# Patient Record
Sex: Female | Born: 1960 | Race: White | Hispanic: No | Smoking: Never smoker
Health system: Southern US, Community
[De-identification: ages and names within clinical notes are randomized; demographics above are authoritative.]

## PROBLEM LIST (undated history)

## (undated) DIAGNOSIS — E119 Type 2 diabetes mellitus without complications: Secondary | ICD-10-CM

## (undated) DIAGNOSIS — N200 Calculus of kidney: Secondary | ICD-10-CM

## (undated) DIAGNOSIS — D219 Benign neoplasm of connective and other soft tissue, unspecified: Secondary | ICD-10-CM

## (undated) DIAGNOSIS — G510 Bell's palsy: Secondary | ICD-10-CM

## (undated) DIAGNOSIS — D869 Sarcoidosis, unspecified: Secondary | ICD-10-CM

## (undated) DIAGNOSIS — K5090 Crohn's disease, unspecified, without complications: Secondary | ICD-10-CM

## (undated) HISTORY — PX: LITHOTRIPSY: SUR834

## (undated) HISTORY — PX: APPENDECTOMY: SHX54

## (undated) HISTORY — PX: UTERINE FIBROID EMBOLIZATION: SHX825

## (undated) HISTORY — PX: CHOLECYSTECTOMY: SHX55

## (undated) HISTORY — PX: TONSILLECTOMY: SUR1361

---

## 1998-11-24 ENCOUNTER — Other Ambulatory Visit: Admission: RE | Admit: 1998-11-24 | Discharge: 1998-11-24 | Payer: Self-pay | Admitting: Obstetrics and Gynecology

## 1999-02-02 ENCOUNTER — Encounter: Payer: Self-pay | Admitting: Obstetrics and Gynecology

## 1999-02-02 ENCOUNTER — Ambulatory Visit (HOSPITAL_COMMUNITY): Admission: RE | Admit: 1999-02-02 | Discharge: 1999-02-02 | Payer: Self-pay | Admitting: Obstetrics and Gynecology

## 1999-03-05 ENCOUNTER — Inpatient Hospital Stay (HOSPITAL_COMMUNITY): Admission: AD | Admit: 1999-03-05 | Discharge: 1999-03-05 | Payer: Self-pay | Admitting: Obstetrics and Gynecology

## 1999-06-09 ENCOUNTER — Encounter (INDEPENDENT_AMBULATORY_CARE_PROVIDER_SITE_OTHER): Payer: Self-pay

## 1999-06-09 ENCOUNTER — Inpatient Hospital Stay (HOSPITAL_COMMUNITY): Admission: AD | Admit: 1999-06-09 | Discharge: 1999-06-12 | Payer: Self-pay | Admitting: Obstetrics and Gynecology

## 1999-07-24 ENCOUNTER — Other Ambulatory Visit: Admission: RE | Admit: 1999-07-24 | Discharge: 1999-07-24 | Payer: Self-pay | Admitting: Obstetrics and Gynecology

## 2000-09-16 ENCOUNTER — Other Ambulatory Visit: Admission: RE | Admit: 2000-09-16 | Discharge: 2000-09-16 | Payer: Self-pay | Admitting: Obstetrics and Gynecology

## 2005-05-20 ENCOUNTER — Encounter: Admission: RE | Admit: 2005-05-20 | Discharge: 2005-05-20 | Payer: Self-pay | Admitting: Obstetrics and Gynecology

## 2006-01-13 ENCOUNTER — Ambulatory Visit (HOSPITAL_COMMUNITY): Admission: RE | Admit: 2006-01-13 | Discharge: 2006-01-13 | Payer: Self-pay | Admitting: Urology

## 2006-02-03 ENCOUNTER — Ambulatory Visit (HOSPITAL_COMMUNITY): Admission: RE | Admit: 2006-02-03 | Discharge: 2006-02-03 | Payer: Self-pay | Admitting: Urology

## 2008-05-08 ENCOUNTER — Encounter: Admission: RE | Admit: 2008-05-08 | Discharge: 2008-05-08 | Payer: Self-pay | Admitting: Sports Medicine

## 2008-06-14 ENCOUNTER — Encounter: Admission: RE | Admit: 2008-06-14 | Discharge: 2008-06-14 | Payer: Self-pay | Admitting: Sports Medicine

## 2008-07-12 ENCOUNTER — Ambulatory Visit: Payer: Self-pay | Admitting: Physical Medicine & Rehabilitation

## 2009-09-02 ENCOUNTER — Encounter: Admission: RE | Admit: 2009-09-02 | Discharge: 2009-09-02 | Payer: Self-pay | Admitting: Endocrinology

## 2009-09-04 ENCOUNTER — Encounter: Admission: RE | Admit: 2009-09-04 | Discharge: 2009-09-04 | Payer: Self-pay | Admitting: Obstetrics and Gynecology

## 2009-09-18 ENCOUNTER — Ambulatory Visit (HOSPITAL_COMMUNITY): Admission: RE | Admit: 2009-09-18 | Discharge: 2009-09-19 | Payer: Self-pay | Admitting: Interventional Radiology

## 2009-10-07 ENCOUNTER — Encounter: Admission: RE | Admit: 2009-10-07 | Discharge: 2009-10-07 | Payer: Self-pay | Admitting: Interventional Radiology

## 2010-04-11 ENCOUNTER — Ambulatory Visit: Payer: Self-pay | Admitting: Diagnostic Radiology

## 2010-04-11 ENCOUNTER — Emergency Department (HOSPITAL_BASED_OUTPATIENT_CLINIC_OR_DEPARTMENT_OTHER): Admission: EM | Admit: 2010-04-11 | Discharge: 2010-04-12 | Payer: Self-pay | Admitting: Emergency Medicine

## 2010-04-12 ENCOUNTER — Emergency Department (HOSPITAL_BASED_OUTPATIENT_CLINIC_OR_DEPARTMENT_OTHER): Admission: EM | Admit: 2010-04-12 | Discharge: 2010-04-12 | Payer: Self-pay | Admitting: Emergency Medicine

## 2010-04-21 ENCOUNTER — Encounter: Admission: RE | Admit: 2010-04-21 | Discharge: 2010-04-21 | Payer: Self-pay | Admitting: Interventional Radiology

## 2010-08-23 ENCOUNTER — Encounter: Admission: RE | Admit: 2010-08-23 | Discharge: 2010-08-23 | Payer: Self-pay | Admitting: Internal Medicine

## 2010-09-03 ENCOUNTER — Encounter: Admission: RE | Admit: 2010-09-03 | Discharge: 2010-09-03 | Payer: Self-pay | Admitting: Sports Medicine

## 2010-09-12 ENCOUNTER — Ambulatory Visit: Payer: Self-pay | Admitting: Diagnostic Radiology

## 2010-09-12 ENCOUNTER — Emergency Department (HOSPITAL_BASED_OUTPATIENT_CLINIC_OR_DEPARTMENT_OTHER): Admission: EM | Admit: 2010-09-12 | Discharge: 2010-09-13 | Payer: Self-pay | Admitting: Emergency Medicine

## 2010-09-13 ENCOUNTER — Emergency Department (HOSPITAL_COMMUNITY): Admission: EM | Admit: 2010-09-13 | Discharge: 2010-09-13 | Payer: Self-pay | Admitting: Emergency Medicine

## 2010-12-14 ENCOUNTER — Encounter: Payer: Self-pay | Admitting: Sports Medicine

## 2011-02-03 LAB — DIFFERENTIAL
Eosinophils Relative: 1 % (ref 0–5)
Lymphocytes Relative: 43 % (ref 12–46)
Monocytes Relative: 4 % (ref 3–12)
Neutrophils Relative %: 49 % (ref 43–77)
Smear Review: ADEQUATE

## 2011-02-03 LAB — POCT I-STAT, CHEM 8
Calcium, Ion: 1.14 mmol/L (ref 1.12–1.32)
Chloride: 109 mEq/L (ref 96–112)
Glucose, Bld: 112 mg/dL — ABNORMAL HIGH (ref 70–99)
Potassium: 4.4 mEq/L (ref 3.5–5.1)

## 2011-02-03 LAB — BASIC METABOLIC PANEL
Calcium: 9.7 mg/dL (ref 8.4–10.5)
Chloride: 102 mEq/L (ref 96–112)
GFR calc Af Amer: >60 mL/min (ref >60)
GFR calc non Af Amer: >60 mL/min (ref >60)
Potassium: 5 mEq/L (ref 3.5–5.1)
Sodium: 144 mEq/L (ref 135–145)

## 2011-02-03 LAB — URINALYSIS, ROUTINE W REFLEX MICROSCOPIC
Bilirubin Urine: NEGATIVE
Glucose, UA: NEGATIVE mg/dL
Glucose, UA: NEGATIVE mg/dL
Hgb urine dipstick: NEGATIVE
Ketones, ur: NEGATIVE mg/dL
Ketones, ur: NEGATIVE mg/dL
Nitrite: NEGATIVE
Specific Gravity, Urine: 1.026 (ref 1.005–1.030)
Urobilinogen, UA: 0.2 mg/dL (ref 0.0–1.0)
pH: 5 (ref 5.0–8.0)

## 2011-02-03 LAB — CBC
MCH: 33.2 pg (ref 26.0–34.0)
MCV: 97.1 fL (ref 78.0–100.0)

## 2011-02-08 LAB — DIFFERENTIAL
Basophils Relative: 1 % (ref 0–1)
Lymphocytes Relative: 7 % — ABNORMAL LOW (ref 12–46)
Lymphs Abs: 0.8 10*3/uL (ref 0.7–4.0)
Monocytes Relative: 12 % (ref 3–12)
Neutrophils Relative %: 80 % — ABNORMAL HIGH (ref 43–77)

## 2011-02-08 LAB — CBC
HCT: 29.2 % — ABNORMAL LOW (ref 36.0–46.0)
Hemoglobin: 10.2 g/dL — ABNORMAL LOW (ref 12.0–15.0)
MCHC: 34.8 g/dL (ref 30.0–36.0)
MCV: 97.6 fL (ref 78.0–100.0)
Platelets: 223 10*3/uL (ref 150–400)
RDW: 11.9 % (ref 11.5–15.5)

## 2011-02-08 LAB — COMPREHENSIVE METABOLIC PANEL
Albumin: 3.5 g/dL (ref 3.5–5.2)
BUN: 15 mg/dL (ref 6–23)
Calcium: 8.5 mg/dL (ref 8.4–10.5)
Chloride: 98 mEq/L (ref 96–112)
GFR calc Af Amer: >60 mL/min (ref >60)
Potassium: 3.3 mEq/L — ABNORMAL LOW (ref 3.5–5.1)
Total Bilirubin: 0.6 mg/dL (ref 0.3–1.2)

## 2011-02-08 LAB — URINALYSIS, ROUTINE W REFLEX MICROSCOPIC
Protein, ur: 30 mg/dL — AB
Specific Gravity, Urine: 1.017 (ref 1.005–1.030)
Urobilinogen, UA: 0.2 mg/dL (ref 0.0–1.0)

## 2011-02-08 LAB — URINE MICROSCOPIC-ADD ON

## 2011-02-08 LAB — URINE CULTURE: Colony Count: >=100000

## 2011-02-15 ENCOUNTER — Other Ambulatory Visit: Payer: Self-pay | Admitting: Sports Medicine

## 2011-02-15 DIAGNOSIS — M549 Dorsalgia, unspecified: Secondary | ICD-10-CM

## 2011-02-17 ENCOUNTER — Other Ambulatory Visit: Payer: Self-pay | Admitting: Sports Medicine

## 2011-02-17 ENCOUNTER — Ambulatory Visit
Admission: RE | Admit: 2011-02-17 | Discharge: 2011-02-17 | Disposition: A | Discharge status: Home / Self Care | Payer: BC Managed Care – PPO | Source: Ambulatory Visit | Attending: Sports Medicine | Admitting: Sports Medicine

## 2011-02-17 DIAGNOSIS — M549 Dorsalgia, unspecified: Secondary | ICD-10-CM

## 2011-02-25 LAB — CREATININE, SERUM: Creatinine, Ser: 0.6 mg/dL (ref 0.4–1.2)

## 2011-02-25 LAB — CBC
Hemoglobin: 13.1 g/dL (ref 12.0–15.0)
MCHC: 34 g/dL (ref 30.0–36.0)
RDW: 12.9 % (ref 11.5–15.5)

## 2011-04-06 NOTE — Group Therapy Note (Signed)
CONSULT REQUESTED BY:  Frazier Butt, D.O.   Consult requested for the evaluation of left buttocks pain.   HISTORY:  [name] who has a 1-year history of low back/left  buttocks pain.  She notes no traumatic injury.  She had been quite  physically active playing tennis and exercising before this occurred.  She had further workup including MRI of the lumbar spine which  reportedly showed L3-L4 degenerative disk.  She had physical therapy  which consists of heat, TENS, and leg strengthening exercise.  She has  pain that is relieved by bringing her knees to her chest.  She has pain  that averages in the 5/10 to 6/10 range.  Because of her decreased  activity, she states she has gained 30 pounds last year.  Pain with  activity is around 6/10, and she has decreased sitting and standing  tolerance of approximately 30 minutes.  She rates her pain as moderate  currently.  She cannot lift heavy weights, but she can do light to  median weights if conveniently positioned.  She can walk about half a  mile at a time.  Her sleep is occasionally disturbed by pain.  She has  difficulty traveling more than 2 hours.  Her Oswestry disability index  is graded as 38% which is in the moderate range.   She has had a sacroiliac injection on May 08, 2008, which helped quite  dramaticallly for about 2 weeks.  However, the second one performed on  June 14, 2008, really did not help at all.  She takes about 2  hydrocodone a day.  She has had some success with ibuprofen as well, but  it only lasted 3-4 hours in her case.  She has not tried any longer  lasting NSAIDs such as Naprosyn.   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   PHYSICAL EXAMINATION:  VITAL SIGNS:  Her blood pressure is 138/90, pulse  120, weight 188 pounds, and height 5 feet 2.  GENERAL:  Mildly anxious female, in no acute distress.  Gait is normal.  She is able to toe walk and heel walk.  Her deep tendon reflexes  are  normal in bilateral upper and lower extremities.  EXTREMITIES:  Without edema.  Her back range of motion is full.  She  does have pain in the PSIS to palpation on the left side only.  Her  motor strength is 5/5 in bilateral deltoid, biceps, triceps, grip, as  well as hip flexion, knee extension, and ankle dorsiflexion.  Her hip  internal and external rotation normal.  Lower extremity, knee, and ankle  range of motion are normal.  Upper extremity strength and range of  motion is normal.  Deep tendon reflexes are normal in bilateral upper  and lower extremity.  Straight leg raising test is negative.  FABER's  maneuver is mildly positive on the left.  This is in the PSIS area.   IMPRESSION:  Left buttocks pain.  It is in the region that is commonly  associated with sacroiliac pain, but lower lumbar facet can refer pain  to a similar region.  I think it is less likely that this is discogenic  pain although technically still in the differential.  Does not have any  nerve root tension signs.  No lower extremity radiating symptomatology.  Also in the differential would be myofascial pain in the gluteus medius  and maximus area.   PLAN:  1. Given that she has  had equivocal or she has had conflicting results      on her 2 sacroiliac injections, we would pursue another pain      generator in this case at L4-L5 and L5-S1 facet complex.  If she      has significant relief with this, may benefit from radiofrequency      procedure.  2. Check urine drug screen prior to prescribing any narcotic      analgesics.  She is generally not in favor of taking narcotic      analgesics and would like to see if she can try something else.  To      this effect, we will prescribe Naprosyn 500 b.i.d. given that she      has had some relief with nonsteroidal antiinflammatory drugs, but      not long enough duration with ibuprofen.   Thank you for this interesting consultation.  I did discuss this case  with  Dr. Margaretha Sheffield, and he will be faxing an MRI result that I do not have  in the original referral pack.  I will see the patient back for the  injection.      Stacey Colon, M.D.  Electronically Signed     AEK/MedQ  D:  07/12/2008 16:17:16  T:  07/13/2008 05:56:27  Job #:  [id number]

## 2011-11-19 ENCOUNTER — Observation Stay (HOSPITAL_COMMUNITY): Payer: BC Managed Care – PPO

## 2011-11-19 ENCOUNTER — Emergency Department (INDEPENDENT_AMBULATORY_CARE_PROVIDER_SITE_OTHER): Payer: BC Managed Care – PPO

## 2011-11-19 ENCOUNTER — Other Ambulatory Visit: Payer: Self-pay

## 2011-11-19 ENCOUNTER — Inpatient Hospital Stay (HOSPITAL_BASED_OUTPATIENT_CLINIC_OR_DEPARTMENT_OTHER)
Admission: EM | Admit: 2011-11-19 | Discharge: 2011-11-21 | DRG: 316 | Disposition: A | Discharge status: Home / Self Care | Payer: BC Managed Care – PPO | Source: Ambulatory Visit | Attending: Family Medicine | Admitting: Family Medicine

## 2011-11-19 DIAGNOSIS — N179 Acute kidney failure, unspecified: Secondary | ICD-10-CM | POA: Diagnosis present

## 2011-11-19 DIAGNOSIS — H9209 Otalgia, unspecified ear: Secondary | ICD-10-CM

## 2011-11-19 DIAGNOSIS — M545 Low back pain, unspecified: Secondary | ICD-10-CM | POA: Diagnosis present

## 2011-11-19 DIAGNOSIS — R29810 Facial weakness: Secondary | ICD-10-CM

## 2011-11-19 DIAGNOSIS — D869 Sarcoidosis, unspecified: Secondary | ICD-10-CM | POA: Diagnosis present

## 2011-11-19 DIAGNOSIS — K5090 Crohn's disease, unspecified, without complications: Secondary | ICD-10-CM | POA: Diagnosis present

## 2011-11-19 DIAGNOSIS — I498 Other specified cardiac arrhythmias: Secondary | ICD-10-CM | POA: Diagnosis not present

## 2011-11-19 DIAGNOSIS — G510 Bell's palsy: Secondary | ICD-10-CM | POA: Diagnosis present

## 2011-11-19 DIAGNOSIS — I10 Essential (primary) hypertension: Secondary | ICD-10-CM | POA: Diagnosis not present

## 2011-11-19 DIAGNOSIS — R51 Headache: Secondary | ICD-10-CM | POA: Diagnosis not present

## 2011-11-19 HISTORY — DX: Sarcoidosis, unspecified: D86.9

## 2011-11-19 HISTORY — DX: Crohn's disease, unspecified, without complications: K50.90

## 2011-11-19 LAB — DIFFERENTIAL
Eosinophils Absolute: 0.5 10*3/uL (ref 0.0–0.7)
Eosinophils Relative: 4 % (ref 0–5)
Lymphs Abs: 2.4 10*3/uL (ref 0.7–4.0)
Monocytes Relative: 10 % (ref 3–12)

## 2011-11-19 LAB — COMPREHENSIVE METABOLIC PANEL
BUN: 40 mg/dL — ABNORMAL HIGH (ref 6–23)
CO2: 22 mEq/L (ref 19–32)
Calcium: 10.4 mg/dL (ref 8.4–10.5)
Creatinine, Ser: 2 mg/dL — ABNORMAL HIGH (ref 0.50–1.10)
GFR calc Af Amer: 32 mL/min — ABNORMAL LOW (ref >90)
GFR calc non Af Amer: 28 mL/min — ABNORMAL LOW (ref >90)
Glucose, Bld: 144 mg/dL — ABNORMAL HIGH (ref 70–99)

## 2011-11-19 LAB — URINALYSIS, ROUTINE W REFLEX MICROSCOPIC
Bilirubin Urine: NEGATIVE
Glucose, UA: 100 mg/dL — AB
Ketones, ur: NEGATIVE mg/dL
Protein, ur: NEGATIVE mg/dL

## 2011-11-19 LAB — PROTIME-INR
INR: 1.11 (ref 0.00–1.49)
Prothrombin Time: 14.5 seconds (ref 11.6–15.2)

## 2011-11-19 LAB — CBC
MCH: 30.3 pg (ref 26.0–34.0)
MCV: 89.6 fL (ref 78.0–100.0)
Platelets: 528 10*3/uL — ABNORMAL HIGH (ref 150–400)
RBC: 4.13 MIL/uL (ref 3.87–5.11)

## 2011-11-19 LAB — URINE MICROSCOPIC-ADD ON

## 2011-11-19 MED ORDER — ENOXAPARIN SODIUM 30 MG/0.3ML ~~LOC~~ SOLN
30.0000 mg | SUBCUTANEOUS | Status: DC
  Administered 2011-11-20 (×2): 30 mg via SUBCUTANEOUS
  Filled 2011-11-19 (×3): qty 0.3

## 2011-11-19 MED ORDER — MORPHINE SULFATE 4 MG/ML IJ SOLN
4.0000 mg | Freq: Once | INTRAMUSCULAR | Status: AC
  Administered 2011-11-19: 4 mg via INTRAVENOUS
  Filled 2011-11-19: qty 1

## 2011-11-19 MED ORDER — SODIUM CHLORIDE 0.9 % IV BOLUS (SEPSIS)
1000.0000 mL | Freq: Once | INTRAVENOUS | Status: AC
  Administered 2011-11-19: 1000 mL via INTRAVENOUS

## 2011-11-19 MED ORDER — ZOLPIDEM TARTRATE 5 MG PO TABS
5.0000 mg | ORAL_TABLET | Freq: Every evening | ORAL | Status: DC | PRN
  Administered 2011-11-20 (×2): 5 mg via ORAL
  Filled 2011-11-19 (×2): qty 1

## 2011-11-19 MED ORDER — SODIUM CHLORIDE 0.9 % IV SOLN
INTRAVENOUS | Status: DC
  Administered 2011-11-20 – 2011-11-21 (×5): via INTRAVENOUS

## 2011-11-19 MED ORDER — METHYLPREDNISOLONE SODIUM SUCC 125 MG IJ SOLR
60.0000 mg | Freq: Two times a day (BID) | INTRAMUSCULAR | Status: DC
  Administered 2011-11-20 (×2): 60 mg via INTRAVENOUS
  Filled 2011-11-19: qty 0.96
  Filled 2011-11-19: qty 2
  Filled 2011-11-19: qty 0.96

## 2011-11-19 MED ORDER — PANTOPRAZOLE SODIUM 40 MG PO TBEC
40.0000 mg | DELAYED_RELEASE_TABLET | Freq: Every day | ORAL | Status: DC
  Administered 2011-11-20: 40 mg via ORAL
  Filled 2011-11-19: qty 1

## 2011-11-19 MED ORDER — SODIUM CHLORIDE 0.9 % IV SOLN
INTRAVENOUS | Status: AC
  Administered 2011-11-19: 15:00:00 via INTRAVENOUS

## 2011-11-19 MED ORDER — ONDANSETRON HCL 4 MG/2ML IJ SOLN: 4.0000 mg | Freq: Three times a day (TID) | INTRAMUSCULAR | Status: AC | PRN

## 2011-11-19 MED ORDER — ALUM & MAG HYDROXIDE-SIMETH 200-200-20 MG/5ML PO SUSP
30.0000 mL | Freq: Four times a day (QID) | ORAL | Status: DC | PRN
  Administered 2011-11-19: 30 mL via ORAL
  Filled 2011-11-19: qty 30

## 2011-11-19 MED ORDER — SODIUM CHLORIDE 0.9 % IV SOLN
INTRAVENOUS | Status: DC
  Administered 2011-11-19 (×2): via INTRAVENOUS

## 2011-11-19 MED ORDER — ASPIRIN 81 MG PO CHEW
324.0000 mg | CHEWABLE_TABLET | Freq: Once | ORAL | Status: AC
  Administered 2011-11-19: 324 mg via ORAL
  Filled 2011-11-19: qty 4

## 2011-11-19 MED ORDER — DEXTROSE 5 % IV SOLN
1.0000 g | INTRAVENOUS | Status: DC
  Administered 2011-11-20: 1 g via INTRAVENOUS
  Filled 2011-11-19 (×2): qty 10

## 2011-11-19 MED ORDER — DEXTROSE 5 % IV SOLN
700.0000 mg | Freq: Three times a day (TID) | INTRAVENOUS | Status: DC
  Administered 2011-11-20 (×3): 700 mg via INTRAVENOUS
  Filled 2011-11-19 (×5): qty 14

## 2011-11-19 MED ORDER — MORPHINE SULFATE 2 MG/ML IJ SOLN
0.5000 mg | INTRAMUSCULAR | Status: DC | PRN
  Administered 2011-11-20 – 2011-11-21 (×4): 0.5 mg via INTRAVENOUS
  Filled 2011-11-19 (×4): qty 1

## 2011-11-19 MED ORDER — OXYCODONE HCL 5 MG PO TABS
5.0000 mg | ORAL_TABLET | ORAL | Status: DC | PRN
  Administered 2011-11-19: 5 mg via ORAL
  Filled 2011-11-19: qty 1

## 2011-11-19 NOTE — Progress Notes (Signed)
ANTIBIOTIC CONSULT NOTE - INITIAL  Pharmacy Consult for IV acyclovir Indication: empiric  Allergies  Allergen Reactions  . Nubain (Nalbuphine Hcl) Other (See Comments)    Increased irritability and nerve sensitivity.      Patient Measurements: Height: 5\' 4"  (162.6 cm) Weight: 170 lb (77.111 kg) IBW/kg (Calculated) : 54.7    Vital Signs: Temp: 99 F (37.2 C) (12/28 2128) Temp src: Oral (12/28 2128) BP: 105/62 mmHg (12/28 2128) Pulse Rate: 115  (12/28 2128) Intake/Output from previous day:   Intake/Output from this shift:    Labs:  Basename 11/19/11 1142  WBC 12.5*  HGB 12.5  PLT 528*  LABCREA --  CREATININE 2.00*   Estimated Creatinine Clearance: 33.5 ml/min (by C-G formula based on Cr of 2). No results found for this basename: VANCOTROUGH:2,VANCOPEAK:2,VANCORANDOM:2,GENTTROUGH:2,GENTPEAK:2,GENTRANDOM:2,TOBRATROUGH:2,TOBRAPEAK:2,TOBRARND:2,AMIKACINPEAK:2,AMIKACINTROU:2,AMIKACIN:2, in the last 72 hours   Microbiology: No results found for this or any previous visit (from the past 720 hour(s)).  Medical History: Past Medical History  Diagnosis Date  . Crohn disease   . Sarcoidosis     Medications:  Scheduled:    . sodium chloride   Intravenous STAT  . aspirin  324 mg Oral Once  . cefTRIAXone (ROCEPHIN)  IV  1 g Intravenous Q24H  . enoxaparin  30 mg Subcutaneous Q24H  . methylPREDNISolone (SOLU-MEDROL) injection  60 mg Intravenous Q12H  .  morphine injection  4 mg Intravenous Once  . pantoprazole  40 mg Oral Q1200  . sodium chloride  1,000 mL Intravenous Once  . sodium chloride  1,000 mL Intravenous Once   Assessment: Beginning empiric antibiotics for ? Neuro infection.  Crcl=33.5 ml/min  Goal of Therapy:  Appropriate dosing  Plan:  1) Acyclovir 700 mg iv Q12 hours 2) Pharmacy to sign off  Thank you.  Elwin Sleight 11/19/2011,10:32 PM

## 2011-11-19 NOTE — ED Provider Notes (Signed)
History     CSN: [id number]  Arrival date & time 11/19/11  1113   First MD Initiated Contact with Patient 11/19/11 1131      Chief Complaint  Patient presents with  . Numbness    (Consider location/radiation/quality/duration/timing/severity/associated sxs/prior treatment) HPI Comments: 50 year old female presents with approximately 24 hours of right-sided facial numbness and weakness. States she first noticed the numbness yesterday afternoon. She did notice the weakness or asymmetry of her face until today while she was brushing her teeth. She has no headache, extremity weakness, sensory deficits outside of the face. She has no difficulty with speech. No recent illnesses. No history of similar. Denies chest pain, shortness of breath, abdominal pain, nausea, vomiting, vision changes.  The history is provided by the patient. No language interpreter was used.    Past Medical History  Diagnosis Date  . Crohn disease   . Sarcoidosis     Past Surgical History  Procedure Date  . Cesarean section   . Appendectomy   . Tonsillectomy     No family history on file.  History  Substance Use Topics  . Smoking status: Never Smoker   . Smokeless tobacco: Not on file  . Alcohol Use: Yes    OB History    Grav Para Term Preterm Abortions TAB SAB Ect Mult Living                  Review of Systems  Constitutional: Negative for fever, activity change, appetite change and fatigue.  HENT: Negative for congestion, sore throat, rhinorrhea, neck pain and neck stiffness.   Eyes: Negative for photophobia, pain and visual disturbance.  Respiratory: Positive for choking. Negative for cough and shortness of breath.   Cardiovascular: Negative for chest pain and palpitations.  Gastrointestinal: Negative for nausea, vomiting and abdominal pain.  Genitourinary: Negative for dysuria, urgency, frequency and flank pain.  Neurological: Positive for facial asymmetry, weakness and numbness. Negative  for dizziness, speech difficulty, light-headedness and headaches.  All other systems reviewed and are negative.    Allergies  Nubain  Home Medications   Current Outpatient Rx  Name Route Sig Dispense Refill  . LISINOPRIL-HYDROCHLOROTHIAZIDE 10-12.5 MG PO TABS Oral Take 1 tablet by mouth daily.      Marland Kitchen RABEPRAZOLE SODIUM 20 MG PO TBEC Oral Take 20 mg by mouth daily.        BP 110/67  Pulse 115  Temp(Src) 98.3 F (36.8 C) (Oral)  Resp 16  Ht 5\' 4"  (1.626 m)  Wt 170 lb (77.111 kg)  BMI 29.18 kg/m2  SpO2 99%  Physical Exam  Nursing note and vitals reviewed. Constitutional: She is oriented to person, place, and time. She appears well-developed and well-nourished. No distress.  HENT:  Head: Normocephalic and atraumatic.  Mouth/Throat: Oropharynx is clear and moist.  Eyes: Conjunctivae and EOM are normal. Pupils are equal, round, and reactive to light.  Neck: Normal range of motion. Neck supple.  Cardiovascular: Regular rhythm, normal heart sounds and intact distal pulses.        Tachycardic rate  Pulmonary/Chest: Effort normal and breath sounds normal. No respiratory distress.  Abdominal: Soft. Bowel sounds are normal. There is no tenderness.  Musculoskeletal: Normal range of motion. She exhibits no tenderness.  Neurological: She is alert and oriented to person, place, and time. She has normal strength and normal reflexes. A cranial nerve deficit and sensory deficit (decreased on R face) is present.       Pt with R sided facial weakness  with smile and closing of eye.  Sparing of forehead although appears slightly weaker than L  Skin: Skin is warm and dry.    ED Course  Procedures (including critical care time)   Date: 11/19/2011  Rate: 122  Rhythm: sinus tachycardia  QRS Axis: normal  Intervals: normal  ST/T Wave abnormalities: normal  Conduction Disutrbances:none  Narrative Interpretation:   Old EKG Reviewed: none available  Labs Reviewed  CBC - Abnormal; Notable  for the following:    WBC 12.5 (*)    Platelets 528 (*)    All other components within normal limits  DIFFERENTIAL - Abnormal; Notable for the following:    Neutro Abs 8.3 (*)    Monocytes Absolute 1.2 (*)    All other components within normal limits  COMPREHENSIVE METABOLIC PANEL - Abnormal; Notable for the following:    Sodium 133 (*)    Glucose, Bld 144 (*)    BUN 40 (*)    Creatinine, Ser 2.00 (*)    Total Protein 9.7 (*)    GFR calc non Af Amer 28 (*)    GFR calc Af Amer 32 (*)    All other components within normal limits  APTT - Abnormal; Notable for the following:    aPTT 43 (*)    All other components within normal limits  PROTIME-INR  TROPONIN I  URINALYSIS, ROUTINE W REFLEX MICROSCOPIC   Ct Head Wo Contrast  11/19/2011  *RADIOLOGY REPORT*  Clinical Data: Right-sided facial droop and uneven small.  Right- sided facial numbness.  Pain behind right ear for 2 days.  CT HEAD WITHOUT CONTRAST  Technique:  Contiguous axial images were obtained from the base of the skull through the vertex without contrast.  Comparison: None.  Findings: No acute intracranial abnormality is present. Specifically, there is no evidence for acute infarct, hemorrhage, mass, hydrocephalus, or extra-axial fluid collection.  The paranasal sinuses and mastoid air cells are clear.  The globes and orbits are intact.  The osseous skull is intact.  IMPRESSION: Negative CT of the head.  Original Report Authenticated By: Jamesetta Orleans. MATTERN, M.D.     1. Facial weakness       MDM  Bell's palsy versus central lesion consistent with stroke. Given the presence of for head sparing I am concerned about a central process although there appears to be slight weakness to the right for head. I do not feel comfortable calling this Bell's palsy without event imaging. CT is negative. Blood work was performed and shows acute renal failure likely secondary to dehydration. Patient received 2 L of IV fluids and was placed on  a maintenance rate of 150. I discussed the case with the [facility] hospitalist who accepted the patient for admission.        Dayton Bailiff, MD 11/19/11 1255

## 2011-11-19 NOTE — ED Notes (Signed)
Pt ambulated to restroom without assistance.

## 2011-11-19 NOTE — H&P (Signed)
PCP:   NNADI,[name], MD, MD   Chief Complaint:  Right facial weakness for one day  HPI: This is a 50 year old very pleasant female, with a history of hypertension, who was on her usual state of health, when she suddenly developed dizziness on Wednesday, yesterday she started to feel numbness around her right lips, she also complained of pain around her right ear. Today when she woke up she noticed a weakness and asymmetric of her face when she was brushing her teeth, she denies any right arm numbness or weakness, she denies any other sensitive to deficit or motor weakness of her upper extremities, she admitted she also noticed some blurring of vision on both eyes couple days ago, she denies any similar episodes on the past, she denies any chest pain, or shortness of breath, denies any headaches or seizure denies any incontinence of urine or bowel, denies any nausea or vomiting denies any abdominal pain, she felt like she may have a urine tract infection with burning in the urine, she denies any fever  Review of Systems:  As above Past Medical History: Past Medical History  Diagnosis Date  . Crohn disease   . Sarcoidosis    Past Surgical History  Procedure Date  . Cesarean section   . Appendectomy   . Tonsillectomy     Medications: Prior to Admission medications   Medication Sig Start Date End Date Taking? Authorizing Provider  lisinopril-hydrochlorothiazide (PRINZIDE,ZESTORETIC) 10-12.5 MG per tablet Take 1 tablet by mouth daily.     Yes Historical Provider, MD  RABEprazole (ACIPHEX) 20 MG tablet Take 20 mg by mouth daily as needed. For upset stomach   Yes Historical Provider, MD    Allergies:   Allergies  Allergen Reactions  . Nubain (Nalbuphine Hcl) Other (See Comments)    Increased irritability and nerve sensitivity.      Social History:  reports that she has never smoked. She does not have any smokeless tobacco history on file. She reports that she drinks alcohol. She  reports that she uses illicit drugs (Marijuana). She lives with her son, she did she is divorce  Family History: Father died with complication of lung cancer, mother still alive with carotid stenosis Physical Exam: Filed Vitals:   11/19/11 1121 11/19/11 1223 11/19/11 1333 11/19/11 2128  BP: 118/72 110/67 119/53 105/62  Pulse: 125 115 108 115  Temp: 98.3 F (36.8 C)   99 F (37.2 C)  TempSrc: Oral   Oral  Resp: 16   20  Height: 5\' 4"  (1.626 m)     Weight: 77.111 kg (170 lb)     SpO2: 98% 99% 100% 95%   She is lying comfortably on bed not on respiratory distress or shortness of breath Neck supple with no lymphadenopathy  Heart S1 and S2 with no added sounds  Breast exam and within normal no masses  Lung normal vesicular breathing with equal air entry abdomen  Soft nontender bowel sounds present no organomegaly  Extremities without edema peripheral pulses intact CNS  The patient has inability to close the eye,noticed disappearance of the nasolabial fold,  There is obvious right fascial paralysis, other cranial nerves seems intact,patient able to shrug her both shoulders, upper and lower extremities without focal weakness. Power 5/5 bilateral ,no clonus, no cerebellar finding .Labs on Admission:   [facility] 11/19/11 1142  NA 133*  K 4.6  CL 97  CO2 22  GLUCOSE 144*  BUN 40*  CREATININE 2.00*  CALCIUM 10.4  MG --  PHOS --    Basename 11/19/11 1142  AST 18  ALT 25  ALKPHOS 89  BILITOT 0.5  PROT 9.7*  ALBUMIN 4.0   No results found for this basename: LIPASE:2,AMYLASE:2 in the last 72 hours  Basename 11/19/11 1142  WBC 12.5*  NEUTROABS 8.3*  HGB 12.5  HCT 37.0  MCV 89.6  PLT 528*    Basename 11/19/11 1142  CKTOTAL --  CKMB --  CKMBINDEX --  TROPONINI <0.30   No results found for this basename: TSH,T4TOTAL,FREET3,T3FREE,THYROIDAB in the last 72 hours No results found for this basename: VITAMINB12:2,FOLATE:2,FERRITIN:2,TIBC:2,IRON:2,RETICCTPCT:2 in the last 72  hours  Radiological Exams on Admission: Ct Head Wo Contrast  11/19/2011  *RADIOLOGY REPORT*  Clinical Data: Right-sided facial droop and uneven small.  Right- sided facial numbness.  Pain behind right ear for 2 days.  CT HEAD WITHOUT CONTRAST  Technique:  Contiguous axial images were obtained from the base of the skull through the vertex without contrast.  Comparison: None.  Findings: No acute intracranial abnormality is present. Specifically, there is no evidence for acute infarct, hemorrhage, mass, hydrocephalus, or extra-axial fluid collection.  The paranasal sinuses and mastoid air cells are clear.  The globes and orbits are intact.  The osseous skull is intact.  IMPRESSION: Negative CT of the head.  Original Report Authenticated By: Jamesetta Orleans. MATTERN, M.D.    Assessment/Plan 1-#1 right fascial Tyron Russell is secondary to Bell's palsy, will start the patient on Solu-Medrol IV 60 mg twice daily and acyclovir air, MRI of the brain bending to exclude other focal causes , patient would continue with aspirin, consider neuro consultation in a.m. to arrange followup 2-acute renal failure patient on HCTZ and is in a pain would be hold and will start the patient on IV fluid, and repeat the [place] patient has good urine and this is likely pre renal. Will check SPEP  And UPEP 3-leukocytosis: role out UTI. Will start ceftriaxone , pending culture Izzy Doubek [phone]. [phone] 11/19/2011, 10:32 PM

## 2011-11-19 NOTE — ED Notes (Signed)
Pt tx to CT.

## 2011-11-19 NOTE — Progress Notes (Signed)
Pt arrived to floor. Vitals taken and stable. Telemetry monitoring put on patient. Oriented to room and call bell. Initial assessment done.

## 2011-11-19 NOTE — ED Notes (Signed)
Via [facility] -spoke with Rhonda 

## 2011-11-19 NOTE — ED Notes (Signed)
Pt aware of need for urine specimen.  States she is unable to void at present.

## 2011-11-19 NOTE — ED Notes (Signed)
Some R sided facial droop noted upon neuro exam.  Pt unable to puff cheeks and smile uneven.

## 2011-11-19 NOTE — ED Notes (Signed)
Pt c/o facial numbness onset 2 days ago.  Has some pain behind R ear.  Pt states she has had some intermittent nausea denies emesis.  A&Ox4.  Pt denies any visual changes.

## 2011-11-19 NOTE — ED Notes (Signed)
Pt c/o increased pain behind R ear rates 7/10

## 2011-11-20 DIAGNOSIS — M545 Low back pain, unspecified: Secondary | ICD-10-CM | POA: Diagnosis present

## 2011-11-20 DIAGNOSIS — G510 Bell's palsy: Secondary | ICD-10-CM | POA: Diagnosis present

## 2011-11-20 LAB — COMPREHENSIVE METABOLIC PANEL
Alkaline Phosphatase: 72 U/L (ref 39–117)
BUN: 30 mg/dL — ABNORMAL HIGH (ref 6–23)
CO2: 21 mEq/L (ref 19–32)
GFR calc Af Amer: 45 mL/min — ABNORMAL LOW (ref >90)
GFR calc non Af Amer: 39 mL/min — ABNORMAL LOW (ref >90)
Glucose, Bld: 159 mg/dL — ABNORMAL HIGH (ref 70–99)
Potassium: 4.5 mEq/L (ref 3.5–5.1)
Total Bilirubin: 0.3 mg/dL (ref 0.3–1.2)
Total Protein: 8.3 g/dL (ref 6.0–8.3)

## 2011-11-20 LAB — CBC
HCT: 29.1 % — ABNORMAL LOW (ref 36.0–46.0)
HCT: 30.8 % — ABNORMAL LOW (ref 36.0–46.0)
Hemoglobin: 10.1 g/dL — ABNORMAL LOW (ref 12.0–15.0)
MCHC: 33.3 g/dL (ref 30.0–36.0)
MCHC: 32.8 g/dL (ref 30.0–36.0)
MCV: 90.9 fL (ref 78.0–100.0)
RDW: 12.6 % (ref 11.5–15.5)
WBC: 10.9 10*3/uL — ABNORMAL HIGH (ref 4.0–10.5)

## 2011-11-20 LAB — CREATININE, SERUM: GFR calc Af Amer: 44 mL/min — ABNORMAL LOW (ref >90)

## 2011-11-20 MED ORDER — ACETAMINOPHEN-CODEINE #3 300-30 MG PO TABS
1.0000 | ORAL_TABLET | ORAL | Status: DC | PRN
  Administered 2011-11-20 – 2011-11-21 (×2): 1 via ORAL
  Filled 2011-11-20 (×2): qty 1

## 2011-11-20 MED ORDER — POLYVINYL ALCOHOL 1.4 % OP SOLN
1.0000 [drp] | OPHTHALMIC | Status: DC
  Administered 2011-11-20 – 2011-11-21 (×17): 1 [drp] via OPHTHALMIC
  Filled 2011-11-20: qty 15

## 2011-11-20 NOTE — Progress Notes (Signed)
Pt passed the bedside swallow evaluation done by nursing at the bedside. Pt had no difficulties swallowing just had to think about it more and eat and drink more slowly.  Thank You Nursing

## 2011-11-20 NOTE — Progress Notes (Signed)
[facility] HOSPITALIST progress note    Interval h/o:-  46 yr olf female with R sided weakness, and woke up with weakness and thought she might be having a storke and decided to come innto the hospital for this after going to [facility]  Subjective: Feels face twist is worse and cannot , as she has drooling and only one sid eof the mouth seems to want to work.  NO weakness elsewhere.  Was gettign some tingling. Does have a h/o migraines, which disappeared a few years ago. Always has an elevated heart rate-has been out of shape for while 2/2 to this and is having pain Has a h/o kidney stones + UTI's    Objective: Vital signs in last 24 hours: Temp:  [97.9 F (36.6 C)-99 F (37.2 C)] 97.9 F (36.6 C) (12/29 1500) Pulse Rate:  [108-117] 117  (12/29 1500) Resp:  [20] 20  (12/29 1500) BP: (96-109)/(59-68) 109/68 mmHg (12/29 1500) SpO2:  [94 %-96 %] 94 % (12/29 1500) Weight change:   Intake/Output Summary (Last 24 hours) at 11/20/11 1530 Last data filed at 11/20/11 0700  Gross per 24 hour  Intake 2367.92 ml  Output      0 ml  Net 2367.92 ml    BP 109/68  Pulse 117  Temp(Src) 97.9 F (36.6 C) (Oral)  Resp 20  Ht 5\' 4"  (1.626 m)  Wt 77.111 kg (170 lb)  BMI 29.18 kg/m2  SpO2 94% General appearance: alert, cooperative and Facial twisting on smiling to the L.  R nasolabial folds are flat.  Tongue is midline and uvula elevates Head: atraumatic Eyes: conjunctivae/corneas clear. PERRL, EOM's intact. Fundi benign. Lungs: clear to auscultation bilaterally Heart: regular rate and rhythm, S1, S2 normal, no murmur, click, rub or gallop and tachycardic.  Extremities: extremities normal, atraumatic, no cyanosis or edema Neurologic: Alert and oriented X 3, normal strength and tone. Normal symmetric reflexes. Normal coordination and gait  Lab Results:  Basename 11/20/11 0547 11/20/11 0017 11/19/11 1142  NA 137 -- 133*  K 4.5 -- 4.6  CL 105 -- 97  CO2 21 -- 22  GLUCOSE 159* -- 144*  BUN  30* -- 40*  CREATININE 1.51* 1.55* --  CALCIUM 10.0 -- 10.4  MG -- -- --  PHOS -- -- --    Basename 11/20/11 0547 11/19/11 1142  AST 14 18  ALT 20 25  ALKPHOS 72 89  BILITOT 0.3 0.5  PROT 8.3 9.7*  ALBUMIN 3.2* 4.0   No results found for this basename: LIPASE:2,AMYLASE:2 in the last 72 hours  Basename 11/20/11 0547 11/20/11 0017 11/19/11 1142  WBC 8.6 10.9* --  NEUTROABS -- -- 8.3*  HGB 10.1* 9.7* --  HCT 30.8* 29.1* --  MCV 90.9 90.9 --  PLT 415* 381 --    Basename 11/19/11 1142  CKTOTAL --  CKMB --  CKMBINDEX --  TROPONINI <0.30   No components found with this basename: POCBNP:3 No results found for this basename: DDIMER:2 in the last 72 hours No results found for this basename: HGBA1C:2 in the last 72 hours No results found for this basename: CHOL:2,HDL:2,LDLCALC:2,TRIG:2,CHOLHDL:2,LDLDIRECT:2 in the last 72 hours No results found for this basename: TSH,T4TOTAL,FREET3,T3FREE,THYROIDAB in the last 72 hours No results found for this basename: VITAMINB12:2,FOLATE:2,FERRITIN:2,TIBC:2,IRON:2,RETICCTPCT:2 in the last 72 hours Micro Results: No results found for this or any previous visit (from the past 240 hour(s)).        Medications: I have reviewed the patient's current medications. Scheduled Meds:   . sodium  chloride   Intravenous STAT  . acyclovir  700 mg Intravenous Q8H  . cefTRIAXone (ROCEPHIN)  IV  1 g Intravenous Q24H  . enoxaparin  30 mg Subcutaneous Q24H  . methylPREDNISolone (SOLU-MEDROL) injection  60 mg Intravenous Q12H  . pantoprazole  40 mg Oral Q1200   Continuous Infusions:   . sodium chloride 100 mL/hr at 11/20/11 1124  . DISCONTD: sodium chloride 125 mL/hr at 11/19/11 1734   PRN Meds:.morphine, ondansetron (ZOFRAN) IV, zolpidem, DISCONTD: alum & mag hydroxide-simeth, DISCONTD: oxyCODONE   Assessment/Plan: Patient Active Hospital Problem List: Bell's palsy (11/20/2011)   Assessment: Ct/MRI neg for acute CVA Her House-Brackmann grade  for Bell's palsy is moderate-will d/c Antibiotics and ant-virals and change steroids to PO 80 daily x 1 week and reasess Will add artifical tears for her L eye  HTN (hypertension) (11/19/2011)   Assessment: well control despite no meds-Will have her visit PCP once d/c to determine need for meds   ARF (acute renal failure) (11/19/2011)   Assessment: Increase IVF to 200 cc/hr Saline Will rpt Bmet am   Lower back pain (11/20/2011)   Assessment: she has a headache and LBP and will start Tylenol #3  In veiw of AKI  Sinus Tachy-likely combination of Anxiety and pain-Increase IVF-give her Pain control and review   LOS: 1 day   Jeancarlos Marchena,JAI 11/20/2011, 3:30 PM

## 2011-11-21 LAB — BASIC METABOLIC PANEL
BUN: 33 mg/dL — ABNORMAL HIGH (ref 6–23)
CO2: 19 mEq/L (ref 19–32)
Calcium: 9.1 mg/dL (ref 8.4–10.5)
GFR calc Af Amer: 51 mL/min — ABNORMAL LOW (ref >90)
Glucose, Bld: 145 mg/dL — ABNORMAL HIGH (ref 70–99)

## 2011-11-21 LAB — URINE CULTURE: Culture  Setup Time: 201212291137

## 2011-11-21 MED ORDER — PREDNISONE (PAK) 10 MG PO TABS: 10.0000 mg | ORAL_TABLET | Freq: Every day | ORAL | Status: AC

## 2011-11-21 MED ORDER — PREDNISOLONE 5 MG PO TABS
60.0000 mg | ORAL_TABLET | Freq: Once | ORAL | Status: AC
  Administered 2011-11-21: 60 mg via ORAL
  Filled 2011-11-21: qty 12

## 2011-11-21 MED ORDER — POLYVINYL ALCOHOL 1.4 % OP SOLN: 1.0000 [drp] | OPHTHALMIC | Status: AC

## 2011-11-21 MED ORDER — ACETAMINOPHEN-CODEINE #3 300-30 MG PO TABS: 1.0000 | ORAL_TABLET | Freq: Four times a day (QID) | ORAL | Status: DC | PRN

## 2011-11-21 NOTE — Discharge Summary (Signed)
Physician Discharge Summary  Patient ID: Stacey Colon MRN: [id number] DOB/AGE: Apr 02, 1961 50 y.o.  Admit date: 11/19/2011 Discharge date: 11/21/2011  Admission Diagnoses: r sided facial weakness  Discharge Diagnoses:  Principal Problem:  *Bell's palsy Active Problems:  HTN (hypertension)  ARF (acute renal failure)  Lower back pain   Discharged Condition: good  Hospital Course: Pleasant 50 y/o cf with htn, chronic lbp presented 12.28 with sudden onset weakenss of R side of face.  No assoc other neuro findings.  Noted to have no seizures or other neuro findings.   Started empirically on high dose IV steroids, Acyclovir, and Ceftriaxone. Changed over to PO Prednisone, Acyclovir d/c as her House-Brackmann grade for Bell's palsy was moderate only with no real good evidence for Acyclovir. Started on eye drops and advised to continue the same and might need Optho follow-up Had significant AKI on admission-reasons thought to be multifactorial-Her Lisinopril-HCTZ was discontinued as well and her Blood pressures were well controlled    Consults: none  Significant Diagnostic Studies: labs: showed AKI and radiology: Ct and MRI of head on this admit  Findings: No acute intracranial abnormality is present.  Specifically, there is no evidence for acute infarct, hemorrhage,  mass, hydrocephalus, or extra-axial fluid collection. The  paranasal sinuses and mastoid air cells are clear. The globes and  orbits are intact. The osseous skull is intact.  IMPRESSION:  Negative CT of the head.   IMPRESSION:  1. Normal MRI of the brain.  2. Mild bilateral mastoid effusions, worse on the right. No  obstructing nasopharyngeal lesion is evident.    Treatments: see above  Discharge Exam: Blood pressure 128/83, pulse 102, temperature 97.3 F (36.3 C), temperature source Oral, resp. rate 17, height 5\' 4"  (1.626 m), weight 77.111 kg (170 lb), SpO2 96.00%. General appearance: alert and  cooperative Head: Normocephalic, without obvious abnormality, atraumatic Neck: no carotid bruit and thyroid not enlarged, symmetric, no tenderness/mass/nodules Cardio: regular rate and rhythm, S1, S2 normal, no murmur, click, rub or gallop Face twisted to the L on smiling, loss of naso-labial fold-no weakensss elsewhere, good sensation throughout  Disposition: D/c home Givec 2-3 days recuperartion time at pt request, as slight facial pain   Follow-up Appointments: see below  Discharge Medications: Current Discharge Medication List    START taking these medications   Details  polyvinyl alcohol (LIQUIFILM TEARS) 1.4 % ophthalmic solution Place 1 drop into the right eye every hour. Qty: 15 mL, Refills: 1    predniSONE (STERAPRED UNI-PAK) 10 MG tablet Take 1 tablet (10 mg total) by mouth daily. Take 3 tablets daily for 1 week and then stop Qty: 21 tablet, Refills: 0      CONTINUE these medications which have NOT CHANGED   Details  RABEprazole (ACIPHEX) 20 MG tablet Take 20 mg by mouth daily as needed. For upset stomach      STOP taking these medications     lisinopril-hydrochlorothiazide (PRINZIDE,ZESTORETIC) 10-12.5 MG per tablet         Follow-up Information    Follow up with NNADI,[name], MD. Make an appointment in 7 days.         Please check blood pressure at follow-up Please refer to Optho if prn   Signed: Iysha Mishkin,JAI 11/21/2011, 8:38 AM

## 2011-11-22 NOTE — Progress Notes (Signed)
11/22/2011 Stacey Colon SPARKS Case Management Note [phone]   Utilization review completed.  

## 2011-11-24 LAB — UIFE/LIGHT CHAINS/TP QN, 24-HR UR
Beta, Urine: DETECTED — AB
Free Kappa Lt Chains,Ur: 13.3 mg/dL — ABNORMAL HIGH (ref 0.14–2.42)
Free Lambda Lt Chains,Ur: 2.27 mg/dL — ABNORMAL HIGH (ref 0.02–0.67)
Gamma Globulin, Urine: DETECTED — AB

## 2011-11-24 LAB — PROTEIN ELECTROPHORESIS, SERUM
M-Spike, %: NOT DETECTED g/dL
Total Protein ELP: 7.9 g/dL (ref 6.0–8.3)

## 2013-03-09 ENCOUNTER — Emergency Department (HOSPITAL_BASED_OUTPATIENT_CLINIC_OR_DEPARTMENT_OTHER)
Admission: EM | Admit: 2013-03-09 | Discharge: 2013-03-10 | Disposition: A | Discharge status: Home / Self Care | Payer: BC Managed Care – PPO | Attending: Emergency Medicine | Admitting: Emergency Medicine

## 2013-03-09 ENCOUNTER — Encounter (HOSPITAL_BASED_OUTPATIENT_CLINIC_OR_DEPARTMENT_OTHER): Payer: Self-pay | Admitting: *Deleted

## 2013-03-09 DIAGNOSIS — Z79899 Other long term (current) drug therapy: Secondary | ICD-10-CM | POA: Insufficient documentation

## 2013-03-09 DIAGNOSIS — Z8742 Personal history of other diseases of the female genital tract: Secondary | ICD-10-CM | POA: Insufficient documentation

## 2013-03-09 DIAGNOSIS — Z8619 Personal history of other infectious and parasitic diseases: Secondary | ICD-10-CM | POA: Insufficient documentation

## 2013-03-09 DIAGNOSIS — Z8719 Personal history of other diseases of the digestive system: Secondary | ICD-10-CM | POA: Insufficient documentation

## 2013-03-09 DIAGNOSIS — R109 Unspecified abdominal pain: Secondary | ICD-10-CM | POA: Insufficient documentation

## 2013-03-09 DIAGNOSIS — R300 Dysuria: Secondary | ICD-10-CM | POA: Insufficient documentation

## 2013-03-09 DIAGNOSIS — Z87442 Personal history of urinary calculi: Secondary | ICD-10-CM | POA: Insufficient documentation

## 2013-03-09 HISTORY — DX: Calculus of kidney: N20.0

## 2013-03-09 HISTORY — DX: Benign neoplasm of connective and other soft tissue, unspecified: D21.9

## 2013-03-09 LAB — URINALYSIS, ROUTINE W REFLEX MICROSCOPIC
Bilirubin Urine: NEGATIVE
Nitrite: NEGATIVE
Specific Gravity, Urine: 1.03 (ref 1.005–1.030)
Urobilinogen, UA: 0.2 mg/dL (ref 0.0–1.0)
pH: 5.5 (ref 5.0–8.0)

## 2013-03-09 LAB — URINE MICROSCOPIC-ADD ON

## 2013-03-09 NOTE — ED Notes (Signed)
Pt reports lithotripsy done in march, developed sharp pain groin pain, difficulty urinating, pain with urination, reports difficulty getting urine to come out, she voids in small amounts, multiple times

## 2013-03-09 NOTE — ED Notes (Signed)
Reports she had lithotripsy in march- c/o urinary difficulty x 2 days- states "it feels like there's a stone stuck down there"

## 2013-03-09 NOTE — ED Provider Notes (Signed)
History     CSN: [id number]  Arrival date & time 03/09/13  2109   First MD Initiated Contact with Patient 03/09/13 2329      Chief Complaint  Patient presents with  . Urinary Retention    (Consider location/radiation/quality/duration/timing/severity/associated sxs/prior treatment) HPI This is a 52 year old female with history of kidney stones. She had lithotripsy on March 31 of this year. She is here tonight with a three-day history of difficulty urinating. She states she feels like there is a stone stuck "down there". She states she has to keep shifting positions in order to fully void her bladder. She is not having any flank pain currently and there is some mild-to-moderate pain associated with urination.   Past Medical History  Diagnosis Date  . Crohn disease   . Sarcoidosis   . Kidney stone   . Fibroids     Past Surgical History  Procedure Laterality Date  . Cesarean section    . Appendectomy    . Tonsillectomy    . Lithotripsy    . Uterine fibroid embolization      No family history on file.  History  Substance Use Topics  . Smoking status: Never Smoker   . Smokeless tobacco: Never Used  . Alcohol Use: 0.6 oz/week    1 Glasses of wine per week    OB History   Grav Para Term Preterm Abortions TAB SAB Ect Mult Living                  Review of Systems  All other systems reviewed and are negative.    Allergies  Nubain  Home Medications   Current Outpatient Rx  Name  Route  Sig  Dispense  Refill  . traMADol (ULTRAM) 50 MG tablet   Oral   Take 50 mg by mouth every 6 (six) hours as needed for pain.         . RABEprazole (ACIPHEX) 20 MG tablet   Oral   Take 20 mg by mouth daily as needed. For upset stomach           BP 124/71  Pulse 104  Temp(Src) 98.4 F (36.9 C) (Oral)  Resp 20  Ht 5\' 4"  (1.626 m)  Wt 165 lb (74.844 kg)  BMI 28.31 kg/m2  SpO2 97%  Physical Exam General: Well-developed, well-nourished female in no acute distress;  appearance consistent with age of record HENT: normocephalic, atraumatic Eyes: pupils equal round and reactive to light; extraocular muscles intact Neck: supple Heart: regular rate and rhythm; no murmurs, rubs or gallops Lungs: clear to auscultation bilaterally Abdomen: soft; mild suprapubic tenderness; no masses or hepatosplenomegaly; bowel sounds present; bladder not palpably distended Extremities: No deformity; full range of motion; pulses normal Neurologic: Awake, alert and oriented; motor function intact in all extremities and symmetric; no facial droop Skin: Warm and dry Psychiatric: Normal mood and affect    ED Course  Procedures (including critical care time)    MDM   Nursing notes and vitals signs, including pulse oximetry, reviewed.  Summary of this visit's results, reviewed by myself:  Labs:  Results for orders placed during the hospital encounter of 03/09/13 (from the past 24 hour(s))  URINALYSIS, ROUTINE W REFLEX MICROSCOPIC     Status: Abnormal   Collection Time    03/09/13  9:49 PM      Result Value Range   Color, Urine YELLOW  YELLOW   APPearance CLEAR  CLEAR   Specific Gravity, Urine 1.030  1.005 -  1.030   pH 5.5  5.0 - 8.0   Glucose, UA NEGATIVE  NEGATIVE mg/dL   Hgb urine dipstick MODERATE (*) NEGATIVE   Bilirubin Urine NEGATIVE  NEGATIVE   Ketones, ur NEGATIVE  NEGATIVE mg/dL   Protein, ur NEGATIVE  NEGATIVE mg/dL   Urobilinogen, UA 0.2  0.0 - 1.0 mg/dL   Nitrite NEGATIVE  NEGATIVE   Leukocytes, UA [place] (*) NEGATIVE  URINE MICROSCOPIC-ADD ON     Status: Abnormal   Collection Time    03/09/13  9:49 PM      Result Value Range   Squamous Epithelial / LPF RARE  RARE   WBC, UA 0-2  <3 WBC/hpf   RBC / HPF 7-10  <3 RBC/hpf   Bacteria, UA RARE  RARE   Crystals CA OXALATE CRYSTALS (*) NEGATIVE    Imaging Studies: Ct Abdomen Pelvis Wo Contrast  03/10/2013  *RADIOLOGY REPORT*  Clinical Data: Difficulty urinating, history of kidney stones, recent  lithotripsy, history Crohn's disease, sarcoidosis, uterine fibroid embolization  CT ABDOMEN AND PELVIS WITHOUT CONTRAST  Technique:  Multidetector CT imaging of the abdomen and pelvis was performed following the standard protocol without intravenous contrast. Sagittal and coronal MPR images reconstructed from axial data set.  Comparison: 08/24/2012, 09/12/2010  Findings: Tiny parenchymal lung densities at inferior chest appear stable since previous exams. Calcified mediastinal lymph nodes with calcified granuloma in left lower lobe. Cholelithiasis. Multiple bilateral nonobstructing renal calculi. No nephrosis, ureteral calcification or ureteral dilatation. Within limits of a nonenhanced exam, no additional abnormalities of the liver, spleen, pancreas, kidneys, or adrenal glands.  Appendix surgically absent by history. Tiny umbilical hernia containing fat. Stomach and bowel loops unremarkable for technique. Calcified leiomyomata within uterus. Unremarkable bladder and adnexae. No mass, adenopathy, free fluid or inflammatory process. Bones unremarkable.  IMPRESSION: Bilateral nonobstructing renal calculi. Cholelithiasis. Tiny umbilical hernia containing fat. Old granulomatous disease at inferior thorax. No definite acute intra-abdominal or intrapelvic abnormalities.   Original Report Authenticated By: Ulyses Southward, M.D.    1:34 AM Cause for the patient's dysuria is not obvious based on labs and CT findings. We will treat symptomatically and refer her back to her urologist the         Hanley Seamen, MD 03/10/13 [phone]

## 2013-03-10 ENCOUNTER — Emergency Department (HOSPITAL_BASED_OUTPATIENT_CLINIC_OR_DEPARTMENT_OTHER): Payer: BC Managed Care – PPO

## 2013-03-10 MED ORDER — FENTANYL CITRATE 0.05 MG/ML IJ SOLN
100.0000 ug | Freq: Once | INTRAMUSCULAR | Status: AC
  Administered 2013-03-10: 100 ug via INTRAVENOUS
  Filled 2013-03-10: qty 2

## 2013-03-10 MED ORDER — SODIUM CHLORIDE 0.9 % IV SOLN
INTRAVENOUS | Status: DC
  Administered 2013-03-10: 01:00:00 via INTRAVENOUS

## 2013-03-10 MED ORDER — PHENAZOPYRIDINE HCL 100 MG PO TABS
200.0000 mg | ORAL_TABLET | Freq: Once | ORAL | Status: AC
  Administered 2013-03-10: 200 mg via ORAL
  Filled 2013-03-10: qty 2

## 2013-03-10 MED ORDER — KETOROLAC TROMETHAMINE 15 MG/ML IJ SOLN
15.0000 mg | Freq: Once | INTRAMUSCULAR | Status: AC
  Administered 2013-03-10: 15 mg via INTRAVENOUS
  Filled 2013-03-10: qty 1

## 2013-03-10 MED ORDER — HYDROCODONE-ACETAMINOPHEN 5-325 MG PO TABS: 1.0000 | ORAL_TABLET | Freq: Four times a day (QID) | ORAL | Status: DC | PRN

## 2013-03-10 MED ORDER — PHENAZOPYRIDINE HCL 200 MG PO TABS: 200.0000 mg | ORAL_TABLET | Freq: Three times a day (TID) | ORAL | Status: DC | PRN

## 2013-03-10 NOTE — ED Notes (Signed)
Patient transported to CT 

## 2013-06-18 ENCOUNTER — Emergency Department (HOSPITAL_BASED_OUTPATIENT_CLINIC_OR_DEPARTMENT_OTHER)
Admission: EM | Admit: 2013-06-18 | Discharge: 2013-06-18 | Disposition: A | Discharge status: Home / Self Care | Payer: BC Managed Care – PPO | Attending: Emergency Medicine | Admitting: Emergency Medicine

## 2013-06-18 ENCOUNTER — Encounter (HOSPITAL_BASED_OUTPATIENT_CLINIC_OR_DEPARTMENT_OTHER): Payer: Self-pay | Admitting: *Deleted

## 2013-06-18 DIAGNOSIS — Z8619 Personal history of other infectious and parasitic diseases: Secondary | ICD-10-CM | POA: Insufficient documentation

## 2013-06-18 DIAGNOSIS — H43399 Other vitreous opacities, unspecified eye: Secondary | ICD-10-CM | POA: Insufficient documentation

## 2013-06-18 DIAGNOSIS — H43391 Other vitreous opacities, right eye: Secondary | ICD-10-CM

## 2013-06-18 DIAGNOSIS — Z8719 Personal history of other diseases of the digestive system: Secondary | ICD-10-CM | POA: Insufficient documentation

## 2013-06-18 DIAGNOSIS — Z87442 Personal history of urinary calculi: Secondary | ICD-10-CM | POA: Insufficient documentation

## 2013-06-18 DIAGNOSIS — Z8742 Personal history of other diseases of the female genital tract: Secondary | ICD-10-CM | POA: Insufficient documentation

## 2013-06-18 MED ORDER — HOMATROPINE HBR 2 % OP SOLN
1.0000 [drp] | Freq: Once | OPHTHALMIC | Status: DC
  Filled 2013-06-18: qty 5

## 2013-06-18 MED ORDER — FLUORESCEIN SODIUM 1 MG OP STRP
1.0000 | ORAL_STRIP | Freq: Once | OPHTHALMIC | Status: DC
  Filled 2013-06-18: qty 1

## 2013-06-18 MED ORDER — TETRACAINE HCL 0.5 % OP SOLN
1.0000 [drp] | Freq: Once | OPHTHALMIC | Status: DC
  Filled 2013-06-18: qty 2

## 2013-06-18 MED ORDER — CYCLOPENTOLATE HCL 1 % OP SOLN
OPHTHALMIC | Status: AC
  Administered 2013-06-18: 1 [drp] via OPHTHALMIC
  Filled 2013-06-18: qty 2

## 2013-06-18 MED ORDER — CYCLOPENTOLATE HCL 1 % OP SOLN
1.0000 [drp] | Freq: Once | OPHTHALMIC | Status: AC
  Administered 2013-06-18: 1 [drp] via OPHTHALMIC

## 2013-06-18 NOTE — ED Notes (Signed)
PT c/o " a bunch of floaters" in right eye

## 2013-06-18 NOTE — ED Notes (Signed)
Pt given f/u appt information for Dr Darel Hong and his contact information

## 2013-06-18 NOTE — ED Provider Notes (Signed)
  CSN: [id number]     Arrival date & time 06/18/13  2104 History     None    Chief Complaint  Patient presents with  . Eye Problem   (Consider location/radiation/quality/duration/timing/severity/associated sxs/prior Treatment) Patient is a 52 y.o. female presenting with eye problem. The history is provided by the patient. No language interpreter was used.  Eye Problem Location:  R eye Severity:  Mild Onset quality:  Sudden Duration:  1 day Timing:  Constant Chronicity:  New Relieved by:  Nothing Associated symptoms: no photophobia and no redness   Risk factors: no conjunctival hemorrhage   Pt reports she has had floaters for 2 years.  Pt reports today she has a large floater,  Pt describes it as looking as large as a fly  Past Medical History  Diagnosis Date  . Crohn disease   . Sarcoidosis   . Kidney stone   . Fibroids    Past Surgical History  Procedure Laterality Date  . Cesarean section    . Appendectomy    . Tonsillectomy    . Lithotripsy    . Uterine fibroid embolization     History reviewed. No pertinent family history. History  Substance Use Topics  . Smoking status: Never Smoker   . Smokeless tobacco: Never Used  . Alcohol Use: 0.6 oz/week    1 Glasses of wine per week   OB History   Grav Para Term Preterm Abortions TAB SAB Ect Mult Living                 Review of Systems  Eyes: Positive for visual disturbance. Negative for photophobia and redness.  All other systems reviewed and are negative.    Allergies  Nubain  Home Medications   Current Outpatient Rx  Name  Route  Sig  Dispense  Refill  . traMADol (ULTRAM) 50 MG tablet   Oral   Take 50 mg by mouth every 6 (six) hours as needed for pain.          BP 120/85  Pulse 88  Temp(Src) 98.7 F (37.1 C) (Oral)  Resp 16  Ht 5\' 3"  (1.6 m)  Wt 170 lb (77.111 kg)  BMI 30.12 kg/m2  SpO2 100% Physical Exam  Nursing note and vitals reviewed. Constitutional: She appears well-developed and  well-nourished.  HENT:  Head: Normocephalic.  Eyes: Conjunctivae are normal. Pupils are equal, round, and reactive to light.  i dilated pt's eye with cyclogel,   Large dark string like box,  Looks like cells, apears to be in the anterior chamber,    Neck: Normal range of motion. Neck supple.  Cardiovascular: Normal rate.   Pulmonary/Chest: Effort normal.  Abdominal: Soft.  Musculoskeletal: Normal range of motion.  Neurological: She is alert.    ED Course   Procedures (including critical care time)  Labs Reviewed - No data to display No results found. 1. Floater, vitreous, right     MDM  Vision 20/40 bilat   Dr. Karma Ganja in to see.   We suspect vitrous tear.  No blood.   I spoke to Dr. Vonna Kotyk He advised to have pt come to his office at 8am to see him.   Nothing to eat or drink after 1 am.  Elson Areas, PA-C 06/18/13 2352

## 2013-06-19 NOTE — ED Provider Notes (Signed)
Medical screening examination/treatment/procedure(s) were conducted as a shared visit with non-physician practitioner(s) and myself.  I personally evaluated the patient during the encounter  Manson Passey square appearing lesion that appears to be in the vitreous.  Retina appears normal.  Only visible after dilation.  D/w optho- he will see in the AM.    Ethelda Chick, MD 06/19/13 [phone]

## 2014-12-18 ENCOUNTER — Encounter (HOSPITAL_BASED_OUTPATIENT_CLINIC_OR_DEPARTMENT_OTHER): Payer: Self-pay

## 2014-12-18 ENCOUNTER — Emergency Department (HOSPITAL_BASED_OUTPATIENT_CLINIC_OR_DEPARTMENT_OTHER)
Admission: EM | Admit: 2014-12-18 | Discharge: 2014-12-19 | Disposition: A | Discharge status: Home / Self Care | Payer: BLUE CROSS/BLUE SHIELD | Attending: Emergency Medicine | Admitting: Emergency Medicine

## 2014-12-18 ENCOUNTER — Emergency Department (HOSPITAL_BASED_OUTPATIENT_CLINIC_OR_DEPARTMENT_OTHER): Payer: BLUE CROSS/BLUE SHIELD

## 2014-12-18 DIAGNOSIS — K8020 Calculus of gallbladder without cholecystitis without obstruction: Secondary | ICD-10-CM | POA: Diagnosis not present

## 2014-12-18 DIAGNOSIS — Z8669 Personal history of other diseases of the nervous system and sense organs: Secondary | ICD-10-CM | POA: Diagnosis not present

## 2014-12-18 DIAGNOSIS — M549 Dorsalgia, unspecified: Secondary | ICD-10-CM | POA: Insufficient documentation

## 2014-12-18 DIAGNOSIS — R109 Unspecified abdominal pain: Secondary | ICD-10-CM

## 2014-12-18 DIAGNOSIS — Z8742 Personal history of other diseases of the female genital tract: Secondary | ICD-10-CM | POA: Insufficient documentation

## 2014-12-18 DIAGNOSIS — Z862 Personal history of diseases of the blood and blood-forming organs and certain disorders involving the immune mechanism: Secondary | ICD-10-CM | POA: Insufficient documentation

## 2014-12-18 DIAGNOSIS — Z87442 Personal history of urinary calculi: Secondary | ICD-10-CM | POA: Insufficient documentation

## 2014-12-18 HISTORY — DX: Bell's palsy: G51.0

## 2014-12-18 LAB — HEPATIC FUNCTION PANEL
ALT: 62 U/L — AB (ref 0–35)
AST: 56 U/L — ABNORMAL HIGH (ref 0–37)
Albumin: 4.1 g/dL (ref 3.5–5.2)
Alkaline Phosphatase: 64 U/L (ref 39–117)
BILIRUBIN DIRECT: 0.2 mg/dL (ref 0.0–0.5)
BILIRUBIN TOTAL: 1.3 mg/dL — AB (ref 0.3–1.2)
Indirect Bilirubin: 1.1 mg/dL — ABNORMAL HIGH (ref 0.3–0.9)
Total Protein: 7.9 g/dL (ref 6.0–8.3)

## 2014-12-18 LAB — URINE MICROSCOPIC-ADD ON

## 2014-12-18 LAB — CBC WITH DIFFERENTIAL/PLATELET
Basophils Absolute: 0 10*3/uL (ref 0.0–0.1)
Basophils Relative: 0 % (ref 0–1)
EOS ABS: 0.1 10*3/uL (ref 0.0–0.7)
Eosinophils Relative: 1 % (ref 0–5)
HEMATOCRIT: 42 % (ref 36.0–46.0)
Hemoglobin: 14 g/dL (ref 12.0–15.0)
LYMPHS PCT: 14 % (ref 12–46)
Lymphs Abs: 1.3 10*3/uL (ref 0.7–4.0)
MCH: 29.9 pg (ref 26.0–34.0)
MCHC: 33.3 g/dL (ref 30.0–36.0)
MCV: 89.7 fL (ref 78.0–100.0)
Monocytes Absolute: 0.8 10*3/uL (ref 0.1–1.0)
Monocytes Relative: 8 % (ref 3–12)
NEUTROS PCT: 77 % (ref 43–77)
Neutro Abs: 7 10*3/uL (ref 1.7–7.7)
PLATELETS: 238 10*3/uL (ref 150–400)
RBC: 4.68 MIL/uL (ref 3.87–5.11)
RDW: 12.6 % (ref 11.5–15.5)
WBC: 9.2 10*3/uL (ref 4.0–10.5)

## 2014-12-18 LAB — URINALYSIS, ROUTINE W REFLEX MICROSCOPIC
Bilirubin Urine: NEGATIVE
Glucose, UA: NEGATIVE mg/dL
Ketones, ur: NEGATIVE mg/dL
Leukocytes, UA: NEGATIVE
Nitrite: NEGATIVE
Protein, ur: NEGATIVE mg/dL
SPECIFIC GRAVITY, URINE: 1.016 (ref 1.005–1.030)
UROBILINOGEN UA: 0.2 mg/dL (ref 0.0–1.0)
pH: 5 (ref 5.0–8.0)

## 2014-12-18 LAB — BASIC METABOLIC PANEL
Anion gap: 4 — ABNORMAL LOW (ref 5–15)
BUN: 18 mg/dL (ref 6–23)
CALCIUM: 9 mg/dL (ref 8.4–10.5)
CO2: 27 mmol/L (ref 19–32)
Chloride: 101 mmol/L (ref 96–112)
Creatinine, Ser: 0.76 mg/dL (ref 0.50–1.10)
GFR calc non Af Amer: >90 mL/min (ref >90)
GLUCOSE: 111 mg/dL — AB (ref 70–99)
POTASSIUM: 4 mmol/L (ref 3.5–5.1)
SODIUM: 132 mmol/L — AB (ref 135–145)

## 2014-12-18 LAB — LIPASE, BLOOD: LIPASE: 41 U/L (ref 11–59)

## 2014-12-18 MED ORDER — ONDANSETRON HCL 4 MG/2ML IJ SOLN
4.0000 mg | Freq: Once | INTRAMUSCULAR | Status: AC
  Administered 2014-12-18: 4 mg via INTRAVENOUS
  Filled 2014-12-18: qty 2

## 2014-12-18 MED ORDER — HYDROMORPHONE HCL 1 MG/ML IJ SOLN
1.0000 mg | Freq: Once | INTRAMUSCULAR | Status: AC
  Administered 2014-12-18: 1 mg via INTRAVENOUS
  Filled 2014-12-18: qty 1

## 2014-12-18 MED ORDER — HYDROCODONE-ACETAMINOPHEN 5-325 MG PO TABS
1.0000 | ORAL_TABLET | Freq: Four times a day (QID) | ORAL | Status: DC | PRN
Start: 2014-12-18 — End: 2018-03-07

## 2014-12-18 MED ORDER — SODIUM CHLORIDE 0.9 % IV SOLN: INTRAVENOUS | Status: DC

## 2014-12-18 MED ORDER — SODIUM CHLORIDE 0.9 % IV BOLUS (SEPSIS)
500.0000 mL | Freq: Once | INTRAVENOUS | Status: AC
  Administered 2014-12-18: 500 mL via INTRAVENOUS

## 2014-12-18 NOTE — ED Notes (Signed)
C/o right flank pain x 6 days- nausea today

## 2014-12-18 NOTE — Discharge Instructions (Signed)
Workup negative for any kidney stones but as we discussed there does appear to be gallstones and they may be causing the symptoms. Labs without significant requirement for admission. Follow-up with your doctor in the next few days. Call of [facility] in [place] for follow-up or any surgeon that your record Dr. wants to utilize. Return for new or worse symptoms. Return for fevers persistent vomiting worse pain.

## 2014-12-18 NOTE — ED Notes (Signed)
Pt unable to [place] [facility] for ED WR

## 2014-12-18 NOTE — ED Notes (Signed)
MD at bedside. 

## 2014-12-18 NOTE — ED Notes (Signed)
Patient transported to CT 

## 2014-12-18 NOTE — ED Provider Notes (Addendum)
CSN: [id number]     Arrival date & time 12/18/14  1906 History  This chart was scribed for Fredia Sorrow, MD by Tula Nakayama, ED Scribe. This patient was seen in room MH03/MH03 and the patient's care was started at 9:5P M.    Chief Complaint  Patient presents with  . Flank Pain   Patient is a 54 y.o. female presenting with flank pain. The history is provided by the patient. No language interpreter was used.  Flank Pain This is a new problem. The current episode started more than 2 days ago. The problem occurs constantly. The problem has not changed since onset.Pertinent negatives include no chest pain, no abdominal pain, no headaches and no shortness of breath. Nothing aggravates the symptoms. Nothing relieves the symptoms.    HPI Comments: Stacey Colon is a 54 y.o. female who presents to the Emergency Department complaining of 7/10, constant right-sided flank pain and back pain that started 1 week ago. She states low grade fever, nausea and vomiting as associated symptoms. She denies aggravating or relieving factors. Pt has a history of kidney stones and states current symptoms are consistent with prior episodes. She had last lithotripsy 1.5 years ago.    Urologist in [facility]   Past Medical History  Diagnosis Date  . Crohn disease   . Sarcoidosis   . Kidney stone   . Fibroids   . Bell's palsy    Past Surgical History  Procedure Laterality Date  . Cesarean section    . Appendectomy    . Tonsillectomy    . Lithotripsy    . Uterine fibroid embolization     No family history on file. History  Substance Use Topics  . Smoking status: Never Smoker   . Smokeless tobacco: Never Used  . Alcohol Use: No   OB History    No data available     Review of Systems  Constitutional: Positive for fever and chills.  HENT: Negative for congestion, rhinorrhea and sore throat.   Eyes: Negative for visual disturbance.  Respiratory: Negative for cough and shortness of breath.    Cardiovascular: Negative for chest pain and leg swelling.  Gastrointestinal: Negative for nausea, vomiting, abdominal pain and diarrhea.  Genitourinary: Positive for flank pain. Negative for dysuria and hematuria.  Musculoskeletal: Positive for back pain. Negative for neck pain.  Skin: Negative for rash.  Neurological: Negative for headaches.  Hematological: Does not bruise/bleed easily.  Psychiatric/Behavioral: Negative for confusion.   Allergies  Nubain  Home Medications   Prior to Admission medications   Medication Sig Start Date End Date Taking? Authorizing Provider  Pravastatin Sodium (PRAVACHOL PO) Take by mouth.   Yes Historical Provider, MD  HYDROcodone-acetaminophen (NORCO/VICODIN) 5-325 MG per tablet Take 1-2 tablets by mouth every 6 (six) hours as needed for moderate pain. 12/18/14   Fredia Sorrow, MD  traMADol (ULTRAM) 50 MG tablet Take 50 mg by mouth every 6 (six) hours as needed for pain.    Historical Provider, MD   BP 113/56 mmHg  Pulse 97  Temp(Src) 98.7 F (37.1 C) (Oral)  Resp 18  Ht 5\' 4"  (1.626 m)  Wt 185 lb (83.915 kg)  BMI 31.74 kg/m2  SpO2 95% Physical Exam  Constitutional: She is oriented to person, place, and time. She appears well-developed and well-nourished. No distress.  HENT:  Head: Normocephalic and atraumatic.  Eyes: Conjunctivae and EOM are normal.  Neck: Neck supple. No tracheal deviation present.  Cardiovascular: Normal rate, regular rhythm and normal  heart sounds.   No murmur heard. Pulmonary/Chest: Effort normal and breath sounds normal. No respiratory distress.  Lungs clear bilaterally  Abdominal: Soft. Bowel sounds are normal. There is no tenderness.  Musculoskeletal:  Tender to right CVA area; nontender right flank  Neurological: She is alert and oriented to person, place, and time. No cranial nerve deficit. She exhibits normal muscle tone. Coordination normal.  Skin: Skin is warm and dry.  Psychiatric: She has a normal mood and  affect. Her behavior is normal.  Nursing note and vitals reviewed.  ED Course  Procedures (including critical care time) DIAGNOSTIC STUDIES: Oxygen Saturation is 95% on RA, normal by my interpretation.    COORDINATION OF CARE: 9:58 PM Discussed treatment plan with pt which includes CT Abdomen Pelvis. Pt agreed to plan.   Labs Review Labs Reviewed  URINALYSIS, ROUTINE W REFLEX MICROSCOPIC - Abnormal; Notable for the following:    Hgb urine dipstick SMALL (*)    All other components within normal limits  BASIC METABOLIC PANEL - Abnormal; Notable for the following:    Sodium 132 (*)    Glucose, Bld 111 (*)    Anion gap 4 (*)    All other components within normal limits  HEPATIC FUNCTION PANEL - Abnormal; Notable for the following:    AST 56 (*)    ALT 62 (*)    Total Bilirubin 1.3 (*)    Indirect Bilirubin 1.1 (*)    All other components within normal limits  URINE MICROSCOPIC-ADD ON  CBC WITH DIFFERENTIAL/PLATELET  LIPASE, BLOOD   Results for orders placed or performed during the hospital encounter of 12/18/14  Urinalysis, Routine w reflex microscopic  Result Value Ref Range   Color, Urine YELLOW YELLOW   APPearance CLEAR CLEAR   Specific Gravity, Urine 1.016 1.005 - 1.030   pH 5.0 5.0 - 8.0   Glucose, UA NEGATIVE NEGATIVE mg/dL   Hgb urine dipstick SMALL (A) NEGATIVE   Bilirubin Urine NEGATIVE NEGATIVE   Ketones, ur NEGATIVE NEGATIVE mg/dL   Protein, ur NEGATIVE NEGATIVE mg/dL   Urobilinogen, UA 0.2 0.0 - 1.0 mg/dL   Nitrite NEGATIVE NEGATIVE   Leukocytes, UA NEGATIVE NEGATIVE  Urine microscopic-add on  Result Value Ref Range   Squamous Epithelial / LPF RARE RARE   RBC / HPF 3-6 <3 RBC/hpf   Bacteria, UA RARE RARE  Basic metabolic panel  Result Value Ref Range   Sodium 132 (L) 135 - 145 mmol/L   Potassium 4.0 3.5 - 5.1 mmol/L   Chloride 101 96 - 112 mmol/L   CO2 27 19 - 32 mmol/L   Glucose, Bld 111 (H) 70 - 99 mg/dL   BUN 18 6 - 23 mg/dL   Creatinine, Ser  0.76 0.50 - 1.10 mg/dL   Calcium 9.0 8.4 - 10.5 mg/dL   GFR calc non Af Amer >90 >90 mL/min   GFR calc Af Amer >90 >90 mL/min   Anion gap 4 (L) 5 - 15  CBC with Differential/Platelet  Result Value Ref Range   WBC 9.2 4.0 - 10.5 K/uL   RBC 4.68 3.87 - 5.11 MIL/uL   Hemoglobin 14.0 12.0 - 15.0 g/dL   HCT 42.0 36.0 - 46.0 %   MCV 89.7 78.0 - 100.0 fL   MCH 29.9 26.0 - 34.0 pg   MCHC 33.3 30.0 - 36.0 g/dL   RDW 12.6 11.5 - 15.5 %   Platelets 238 150 - 400 K/uL   Neutrophils Relative % 77 43 - 77 %  Neutro Abs 7.0 1.7 - 7.7 K/uL   Lymphocytes Relative 14 12 - 46 %   Lymphs Abs 1.3 0.7 - 4.0 K/uL   Monocytes Relative 8 3 - 12 %   Monocytes Absolute 0.8 0.1 - 1.0 K/uL   Eosinophils Relative 1 0 - 5 %   Eosinophils Absolute 0.1 0.0 - 0.7 K/uL   Basophils Relative 0 0 - 1 %   Basophils Absolute 0.0 0.0 - 0.1 K/uL  Hepatic function panel  Result Value Ref Range   Total Protein 7.9 6.0 - 8.3 g/dL   Albumin 4.1 3.5 - 5.2 g/dL   AST 56 (H) 0 - 37 U/L   ALT 62 (H) 0 - 35 U/L   Alkaline Phosphatase 64 39 - 117 U/L   Total Bilirubin 1.3 (H) 0.3 - 1.2 mg/dL   Bilirubin, Direct 0.2 0.0 - 0.5 mg/dL   Indirect Bilirubin 1.1 (H) 0.3 - 0.9 mg/dL  Lipase, blood  Result Value Ref Range   Lipase 41 11 - 59 U/L     Imaging Review Ct Abdomen Pelvis Wo Contrast  12/18/2014   CLINICAL DATA:  Acute onset of right flank and back pain for 6 days. Nausea, vomiting and hematuria. Initial encounter.  EXAM: CT ABDOMEN AND PELVIS WITHOUT CONTRAST  TECHNIQUE: Multidetector CT imaging of the abdomen and pelvis was performed following the standard protocol without IV contrast.  COMPARISON:  CT of the abdomen and pelvis from 03/10/2013  FINDINGS: A calcified granuloma is noted near the left lung base. Calcified nodes are noted about the distal esophagus. These likely reflect sequelae of remote granulomatous disease.  The liver and spleen are unremarkable in appearance. The gallbladder is distended, of uncertain  significance. A few small stones are seen dependently within the gallbladder. The pancreas and adrenal glands are unremarkable.  Several nonobstructing right renal stones are seen, measuring up to 1.0 cm in size. There is no evidence of hydronephrosis. No obstructing ureteral stones are seen. The left kidney is grossly unremarkable. No perinephric stranding is seen.  No free fluid is identified. The small bowel is unremarkable in appearance. The stomach is within normal limits. No acute vascular abnormalities are seen. Scattered calcification is seen along the abdominal aorta and its branches. This includes at the origins of the renal arteries bilaterally.  The patient is status post appendectomy. The colon is unremarkable in appearance.  The bladder is relatively decompressed and grossly unremarkable. Small calcified fibroids are seen within the uterus. The uterus is otherwise unremarkable. No suspicious adnexal masses are seen. The ovaries are relatively symmetric. No inguinal lymphadenopathy is seen.  No acute osseous abnormalities are identified.  IMPRESSION: 1. No acute abnormality seen to explain the patient's symptoms. 2. Nonobstructing right renal stones measure up to 1.0 cm in size. Kidneys otherwise unremarkable. 3. Gallbladder distended, of uncertain significance. Cholelithiasis noted. Gallbladder otherwise unremarkable. 4. Calcified nodes about the distal esophagus likely reflect sequelae of remote granulomatous disease. 5. Scattered calcification along the abdominal aorta and its branches, including at the origins of the renal arteries bilaterally. 6. Small calcified fibroids within the uterus.   Electronically Signed   By: Garald Balding M.D.   On: 12/18/2014 22:47     EKG Interpretation None      MDM   Final diagnoses:  Flank pain  Calculus of gallbladder without cholecystitis without obstruction   CT scan negative for any ureteral stones. Patient gallbladder does show evidence of  cholelithiasis. Some questionable distention. Patient's symptoms could be  secondary to biliary colic. Patient's nontender right upper quadrant. No leukocytosis. Lipase and liver function test of been added on. Patient does have physician to follow-up with. But no evidence of urinary tract infection no evidence of ureteral stone. Patient improved with pain medicine here.  Liver function tests without elevation in alkaline phosphatase. Some elevation in total bilirubin. Patient without evidence of acute cholecystitis at this point in time. Follow-up with general surgery will be important patient understands. Patient all return for any new or worse symptoms. Patient's pain improved. Patient nontender to palpation over the gallbladder.  I personally performed the services described in this documentation, which was scribed in my presence. The recorded information has been reviewed and is accurate.     Fredia Sorrow, MD 12/18/14 [id number]  Fredia Sorrow, MD 12/18/14 [phone]

## 2014-12-19 NOTE — ED Notes (Signed)
Crackers, cheese and ginger ale offered per pt request.

## 2015-01-13 ENCOUNTER — Encounter (HOSPITAL_BASED_OUTPATIENT_CLINIC_OR_DEPARTMENT_OTHER): Payer: Self-pay | Admitting: *Deleted

## 2015-01-13 ENCOUNTER — Emergency Department (HOSPITAL_BASED_OUTPATIENT_CLINIC_OR_DEPARTMENT_OTHER)
Admission: EM | Admit: 2015-01-13 | Discharge: 2015-01-13 | Disposition: A | Discharge status: Home / Self Care | Payer: BLUE CROSS/BLUE SHIELD | Attending: Emergency Medicine | Admitting: Emergency Medicine

## 2015-01-13 DIAGNOSIS — R1084 Generalized abdominal pain: Secondary | ICD-10-CM | POA: Diagnosis not present

## 2015-01-13 DIAGNOSIS — R112 Nausea with vomiting, unspecified: Secondary | ICD-10-CM | POA: Diagnosis present

## 2015-01-13 DIAGNOSIS — Z8719 Personal history of other diseases of the digestive system: Secondary | ICD-10-CM | POA: Insufficient documentation

## 2015-01-13 DIAGNOSIS — Z8669 Personal history of other diseases of the nervous system and sense organs: Secondary | ICD-10-CM | POA: Diagnosis not present

## 2015-01-13 DIAGNOSIS — R509 Fever, unspecified: Secondary | ICD-10-CM

## 2015-01-13 DIAGNOSIS — Z87442 Personal history of urinary calculi: Secondary | ICD-10-CM | POA: Insufficient documentation

## 2015-01-13 DIAGNOSIS — Z862 Personal history of diseases of the blood and blood-forming organs and certain disorders involving the immune mechanism: Secondary | ICD-10-CM | POA: Diagnosis not present

## 2015-01-13 DIAGNOSIS — R197 Diarrhea, unspecified: Secondary | ICD-10-CM | POA: Diagnosis not present

## 2015-01-13 LAB — URINALYSIS, ROUTINE W REFLEX MICROSCOPIC
BILIRUBIN URINE: NEGATIVE
GLUCOSE, UA: NEGATIVE mg/dL
Hgb urine dipstick: NEGATIVE
KETONES UR: NEGATIVE mg/dL
NITRITE: NEGATIVE
PH: 5.5 (ref 5.0–8.0)
Protein, ur: 30 mg/dL — AB
Specific Gravity, Urine: 1.028 (ref 1.005–1.030)
UROBILINOGEN UA: 0.2 mg/dL (ref 0.0–1.0)

## 2015-01-13 LAB — CBC WITH DIFFERENTIAL/PLATELET
BASOS ABS: 0 10*3/uL (ref 0.0–0.1)
BASOS PCT: 0 % (ref 0–1)
EOS ABS: 0.1 10*3/uL (ref 0.0–0.7)
EOS PCT: 1 % (ref 0–5)
HEMATOCRIT: 38.3 % (ref 36.0–46.0)
HEMOGLOBIN: 13 g/dL (ref 12.0–15.0)
LYMPHS PCT: 48 % — AB (ref 12–46)
Lymphs Abs: 5 10*3/uL — ABNORMAL HIGH (ref 0.7–4.0)
MCH: 30.3 pg (ref 26.0–34.0)
MCHC: 33.9 g/dL (ref 30.0–36.0)
MCV: 89.3 fL (ref 78.0–100.0)
MONO ABS: 1.4 10*3/uL — AB (ref 0.1–1.0)
MONOS PCT: 13 % — AB (ref 3–12)
NEUTROS ABS: 4 10*3/uL (ref 1.7–7.7)
NEUTROS PCT: 38 % — AB (ref 43–77)
Platelets: 281 10*3/uL (ref 150–400)
RBC: 4.29 MIL/uL (ref 3.87–5.11)
RDW: 12.8 % (ref 11.5–15.5)
WBC: 10.5 10*3/uL (ref 4.0–10.5)

## 2015-01-13 LAB — URINE MICROSCOPIC-ADD ON

## 2015-01-13 LAB — COMPREHENSIVE METABOLIC PANEL
ALBUMIN: 3.6 g/dL (ref 3.5–5.2)
ALK PHOS: 63 U/L (ref 39–117)
ALT: 50 U/L — ABNORMAL HIGH (ref 0–35)
ANION GAP: 4 — AB (ref 5–15)
AST: 39 U/L — ABNORMAL HIGH (ref 0–37)
BILIRUBIN TOTAL: 0.8 mg/dL (ref 0.3–1.2)
BUN: 20 mg/dL (ref 6–23)
CO2: 25 mmol/L (ref 19–32)
Calcium: 8.5 mg/dL (ref 8.4–10.5)
Chloride: 104 mmol/L (ref 96–112)
Creatinine, Ser: 0.73 mg/dL (ref 0.50–1.10)
GLUCOSE: 106 mg/dL — AB (ref 70–99)
Potassium: 3.8 mmol/L (ref 3.5–5.1)
SODIUM: 133 mmol/L — AB (ref 135–145)
Total Protein: 8.2 g/dL (ref 6.0–8.3)

## 2015-01-13 LAB — LIPASE, BLOOD: Lipase: 52 U/L (ref 11–59)

## 2015-01-13 MED ORDER — SODIUM CHLORIDE 0.9 % IV BOLUS (SEPSIS)
1000.0000 mL | Freq: Once | INTRAVENOUS | Status: AC
  Administered 2015-01-13: 1000 mL via INTRAVENOUS

## 2015-01-13 MED ORDER — ONDANSETRON HCL 4 MG/2ML IJ SOLN
4.0000 mg | Freq: Once | INTRAMUSCULAR | Status: AC
  Administered 2015-01-13: 4 mg via INTRAVENOUS
  Filled 2015-01-13: qty 2

## 2015-01-13 MED ORDER — ACETAMINOPHEN 325 MG PO TABS
650.0000 mg | ORAL_TABLET | Freq: Once | ORAL | Status: AC
  Administered 2015-01-13: 650 mg via ORAL
  Filled 2015-01-13: qty 2

## 2015-01-13 NOTE — Discharge Instructions (Signed)
Collect a stool sample and put on ice and take to your doctor's office to have them test for c. Diff. .  Follow up with your surgeon on Thursday or call him tomorrow if you are not feeling any better.   Diarrhea Diarrhea is frequent loose and watery bowel movements. It can cause you to feel weak and dehydrated. Dehydration can cause you to become tired and thirsty, have a dry mouth, and have decreased urination that often is dark yellow. Diarrhea is a sign of another problem, most often an infection that will not last long. In most cases, diarrhea typically lasts 2-3 days. However, it can last longer if it is a sign of something more serious. It is important to treat your diarrhea as directed by your caregiver to lessen or prevent future episodes of diarrhea. CAUSES  Some common causes include:  Gastrointestinal infections caused by viruses, bacteria, or parasites.  Food poisoning or food allergies.  Certain medicines, such as antibiotics, chemotherapy, and laxatives.  Artificial sweeteners and fructose.  Digestive disorders. HOME CARE INSTRUCTIONS  Ensure adequate fluid intake (hydration): Have 1 cup (8 oz) of fluid for each diarrhea episode. Avoid fluids that contain simple sugars or sports drinks, fruit juices, whole milk products, and sodas. Your urine should be clear or pale yellow if you are drinking enough fluids. Hydrate with an oral rehydration solution that you can purchase at pharmacies, retail stores, and online. You can prepare an oral rehydration solution at home by mixing the following ingredients together:   - tsp table salt.   tsp baking soda.   tsp salt substitute containing potassium chloride.  1  tablespoons sugar.  1 L (34 oz) of water.  Certain foods and beverages may increase the speed at which food moves through the gastrointestinal (GI) tract. These foods and beverages should be avoided and include:  Caffeinated and alcoholic beverages.  High-fiber foods,  such as raw fruits and vegetables, nuts, seeds, and whole grain breads and cereals.  Foods and beverages sweetened with sugar alcohols, such as xylitol, sorbitol, and mannitol.  Some foods may be well tolerated and may help thicken stool including:  Starchy foods, such as rice, toast, pasta, low-sugar cereal, oatmeal, grits, baked potatoes, crackers, and bagels.  Bananas.  Applesauce.  Add probiotic-rich foods to help increase healthy bacteria in the GI tract, such as yogurt and fermented milk products.  Wash your hands well after each diarrhea episode.  Only take over-the-counter or prescription medicines as directed by your caregiver.  Take a warm bath to relieve any burning or pain from frequent diarrhea episodes. SEEK IMMEDIATE MEDICAL CARE IF:   You are unable to keep fluids down.  You have persistent vomiting.  You have blood in your stool, or your stools are black and tarry.  You do not urinate in 6-8 hours, or there is only a small amount of very dark urine.  You have abdominal pain that increases or localizes.  You have weakness, dizziness, confusion, or light-headedness.  You have a severe headache.  Your diarrhea gets worse or does not get better.  You have a fever or persistent symptoms for more than 2-3 days.  You have a fever and your symptoms suddenly get worse. MAKE SURE YOU:   Understand these instructions.  Will watch your condition.  Will get help right away if you are not doing well or get worse. Document Released: 10/29/2002 Document Revised: 03/25/2014 Document Reviewed: 07/16/2012 [facility] Patient Information 2015 [place], [place]. This information is  not intended to replace advice given to you by your health care provider. Make sure you discuss any questions you have with your health care provider.  Fever, Adult A fever is a higher than normal body temperature. In an adult, an oral temperature around 98.6 F (37 C) is considered normal. A  temperature of 100.4 F (38 C) or higher is generally considered a fever. Mild or moderate fevers generally have no long-term effects and often do not require treatment. Extreme fever (greater than or equal to 106 F or 41.1 C) can cause seizures. The sweating that may occur with repeated or prolonged fever may cause dehydration. Elderly people can develop confusion during a fever. A measured temperature can vary with:  Age.  Time of day.  Method of measurement (mouth, underarm, rectal, or ear). The fever is confirmed by taking a temperature with a thermometer. Temperatures can be taken different ways. Some methods are accurate and some are not.  An oral temperature is used most commonly. Electronic thermometers are fast and accurate.  An ear temperature will only be accurate if the thermometer is positioned as recommended by the manufacturer.  A rectal temperature is accurate and done for those adults who have a condition where an oral temperature cannot be taken.  An underarm (axillary) temperature is not accurate and not recommended. Fever is a symptom, not a disease.  CAUSES   Infections commonly cause fever.  Some noninfectious causes for fever include:  Some arthritis conditions.  Some thyroid or adrenal gland conditions.  Some immune system conditions.  Some types of cancer.  A medicine reaction.  High doses of certain street drugs such as methamphetamine.  Dehydration.  Exposure to high outside or room temperatures.  Occasionally, the source of a fever cannot be determined. This is sometimes called a "fever of unknown origin" (FUO).  Some situations may lead to a temporary rise in body temperature that may go away on its own. Examples are:  Childbirth.  Surgery.  Intense exercise. HOME CARE INSTRUCTIONS   Take appropriate medicines for fever. Follow dosing instructions carefully. If you use acetaminophen to reduce the fever, be careful to avoid taking  other medicines that also contain acetaminophen. Do not take aspirin for a fever if you are younger than age 67. There is an association with Reye's syndrome. Reye's syndrome is a rare but potentially deadly disease.  If an infection is present and antibiotics have been prescribed, take them as directed. Finish them even if you start to feel better.  Rest as needed.  Maintain an adequate fluid intake. To prevent dehydration during an illness with prolonged or recurrent fever, you may need to drink extra fluid.Drink enough fluids to keep your urine clear or pale yellow.  Sponging or bathing with room temperature water may help reduce body temperature. Do not use ice water or alcohol sponge baths.  Dress comfortably, but do not over-bundle. SEEK MEDICAL CARE IF:   You are unable to keep fluids down.  You develop vomiting or diarrhea.  You are not feeling at least partly better after 3 days.  You develop new symptoms or problems. SEEK IMMEDIATE MEDICAL CARE IF:   You have shortness of breath or trouble breathing.  You develop excessive weakness.  You are dizzy or you faint.  You are extremely thirsty or you are making little or no urine.  You develop new pain that was not there before (such as in the head, neck, chest, back, or abdomen).  You have  persistent vomiting and diarrhea for more than 1 to 2 days.  You develop a stiff neck or your eyes become sensitive to light.  You develop a skin rash.  You have a fever or persistent symptoms for more than 2 to 3 days.  You have a fever and your symptoms suddenly get worse. MAKE SURE YOU:   Understand these instructions.  Will watch your condition.  Will get help right away if you are not doing well or get worse. Document Released: 05/04/2001 Document Revised: 03/25/2014 Document Reviewed: 09/09/2011 [facility] Patient Information 2015 [place], [place]. This information is not intended to replace advice given to you by your  health care provider. Make sure you discuss any questions you have with your health care provider.

## 2015-01-13 NOTE — ED Notes (Signed)
Pt has had nv/ d since having gallbladder removed 1 week ago,  Last vomited yesterday,  Denies nausea or pain at present

## 2015-01-13 NOTE — ED Notes (Signed)
Pt given ice chips

## 2015-01-13 NOTE — ED Notes (Signed)
Pt c/o n/v/d gallbladder removal x 1 week ago

## 2015-01-13 NOTE — ED Provider Notes (Signed)
CSN: [id number]     Arrival date & time 01/13/15  1851 History   This chart was scribed for Stacey Johns, MD by [name], ED Scribe. The patient was seen in MH04/MH04 and the patient's care was started at 9:37 PM.  Chief Complaint  Patient presents with  . Emesis   Patient is a 54 y.o. female presenting with vomiting. The history is provided by the patient. No language interpreter was used.  Emesis Associated symptoms: diarrhea   Associated symptoms: no abdominal pain, no arthralgias, no chills, no headaches and no sore throat     HPI Comments: Stacey Colon is a 55 y.o. female who presents to the Emergency Department complaining of a fever , nausea, vomiting, and diarrhea since her surgery one week ago. Pt had her gallbladder removed one week ago by Dr. Phineas Douglas at [facility]. Pt also has decreased urinary frequency. Last dose of pain medication was at 4:00 PM today, pt states she can't tolerate pain medication  She had a fever of 102 today. She is scheduled to meet Dr. Phineas Douglas on Thursday. She denies sore throat, cough and congestion.  She denies abd pain other than some mild soreness from the surgery.  No dysuria.    Past Medical History  Diagnosis Date  . Crohn disease   . Sarcoidosis   . Kidney stone   . Fibroids   . Bell's palsy    Past Surgical History  Procedure Laterality Date  . Cesarean section    . Appendectomy    . Tonsillectomy    . Lithotripsy    . Uterine fibroid embolization    . Cholecystectomy     History reviewed. No pertinent family history. History  Substance Use Topics  . Smoking status: Never Smoker   . Smokeless tobacco: Never Used  . Alcohol Use: No   OB History    No data available     Review of Systems  Constitutional: Positive for fever. Negative for chills, diaphoresis and fatigue.  HENT: Negative for congestion, rhinorrhea, sneezing and sore throat.   Eyes: Negative.   Respiratory: Negative for cough, chest tightness and  shortness of breath.   Cardiovascular: Negative for chest pain and leg swelling.  Gastrointestinal: Positive for nausea, vomiting and diarrhea. Negative for abdominal pain and blood in stool.  Genitourinary: Negative for frequency, hematuria, flank pain and difficulty urinating.  Musculoskeletal: Negative for back pain and arthralgias.  Skin: Negative for rash.  Neurological: Negative for dizziness, speech difficulty, weakness, numbness and headaches.   Allergies  Nubain  Home Medications   Prior to Admission medications   Medication Sig Start Date End Date Taking? Authorizing Provider  HYDROcodone-acetaminophen (NORCO/VICODIN) 5-325 MG per tablet Take 1-2 tablets by mouth every 6 (six) hours as needed for moderate pain. 12/18/14   Fredia Sorrow, MD  Pravastatin Sodium (PRAVACHOL PO) Take by mouth.    Historical Provider, MD  traMADol (ULTRAM) 50 MG tablet Take 50 mg by mouth every 6 (six) hours as needed for pain.    Historical Provider, MD   BP 149/79 mmHg  Pulse 116  Temp(Src) 99.7 F (37.6 C) (Oral)  Resp 20  Wt 178 lb (80.74 kg)  SpO2 97% Physical Exam  Constitutional: She is oriented to person, place, and time. She appears well-developed and well-nourished.  HENT:  Head: Normocephalic and atraumatic.  Eyes: Pupils are equal, round, and reactive to light.  Neck: Normal range of motion. Neck supple.  Cardiovascular: Normal rate, regular rhythm  and normal heart sounds.   Pulmonary/Chest: Effort normal and breath sounds normal. No respiratory distress. She has no wheezes. She has no rales. She exhibits no tenderness.  Abdominal: Soft. Bowel sounds are normal. There is tenderness (mild generalized tenderness). There is no rebound and no guarding.  Surgical incisions appear well healing without signs of infection  Musculoskeletal: Normal range of motion. She exhibits no edema.  Lymphadenopathy:    She has no cervical adenopathy.  Neurological: She is alert and oriented to  person, place, and time.  Skin: Skin is warm and dry. No rash noted.  Psychiatric: She has a normal mood and affect.    ED Course  Procedures  DIAGNOSTIC STUDIES: Oxygen Saturation is 97% on room air, normal by my interpretation.    COORDINATION OF CARE: 9:43 PM Discussed treatment plan with pt at bedside and pt agreed to plan.  Labs Review Labs Reviewed  COMPREHENSIVE METABOLIC PANEL - Abnormal; Notable for the following:    Sodium 133 (*)    Glucose, Bld 106 (*)    AST 39 (*)    ALT 50 (*)    Anion gap 4 (*)    All other components within normal limits  CBC WITH DIFFERENTIAL/PLATELET - Abnormal; Notable for the following:    Neutrophils Relative % 38 (*)    Lymphocytes Relative 48 (*)    Monocytes Relative 13 (*)    Lymphs Abs 5.0 (*)    Monocytes Absolute 1.4 (*)    All other components within normal limits  URINALYSIS, ROUTINE W REFLEX MICROSCOPIC - Abnormal; Notable for the following:    APPearance CLOUDY (*)    Protein, ur 30 (*)    Leukocytes, UA SMALL (*)    All other components within normal limits  URINE MICROSCOPIC-ADD ON - Abnormal; Notable for the following:    Squamous Epithelial / LPF FEW (*)    All other components within normal limits  URINE CULTURE  LIPASE, BLOOD    Imaging Review No results found.   EKG Interpretation None      MDM   Final diagnoses:  Non-intractable vomiting with nausea, vomiting of unspecified type  Diarrhea  Fever and chills   Pt one week s/p lap chole with fever, n/v/d.  No URI symptoms.  No urinary symptoms.  Urine not impressive for UTI, sent for culture.  No rash or signs of cellulitis.  Pt is feeling much better after fluids and zofran.  Tolerating PO fluids.  Spoke with surgeon on call for Dr. Phineas Douglas who is ok with pt being discharged.  He recommends c.diff testing, but pt cannot produce a stool sample.  Pt discharged with specimen cup to collect sample at home and take to surgeon office for testing for c.diff.   Advised pt to keep on ice.  Advised to call her surgeon tomorrow if she is not feeling better or return to ED if symptoms worsen.  She has appt on Thursday with Dr. Phineas Douglas.  Pt's HR is still 112 on discharge and I wanted to give her more fluids, but she if not wanting to stay any longer in the ED.  Says that she is feeling much better and is ready to leave.  I personally performed the services described in this documentation, which was scribed in my presence.  The recorded information has been reviewed and considered.      Stacey Johns, MD 01/13/15 [phone]

## 2015-01-15 LAB — URINE CULTURE: Colony Count: 80000

## 2016-07-28 ENCOUNTER — Emergency Department (HOSPITAL_COMMUNITY): Payer: BLUE CROSS/BLUE SHIELD

## 2016-07-28 ENCOUNTER — Emergency Department (HOSPITAL_COMMUNITY)
Admission: EM | Admit: 2016-07-28 | Discharge: 2016-07-28 | Disposition: A | Discharge status: Home / Self Care | Payer: BLUE CROSS/BLUE SHIELD | Attending: Emergency Medicine | Admitting: Emergency Medicine

## 2016-07-28 ENCOUNTER — Encounter (HOSPITAL_COMMUNITY): Payer: Self-pay | Admitting: Emergency Medicine

## 2016-07-28 ENCOUNTER — Other Ambulatory Visit: Payer: Self-pay

## 2016-07-28 DIAGNOSIS — K279 Peptic ulcer, site unspecified, unspecified as acute or chronic, without hemorrhage or perforation: Secondary | ICD-10-CM | POA: Diagnosis not present

## 2016-07-28 DIAGNOSIS — I10 Essential (primary) hypertension: Secondary | ICD-10-CM | POA: Diagnosis not present

## 2016-07-28 DIAGNOSIS — Z7984 Long term (current) use of oral hypoglycemic drugs: Secondary | ICD-10-CM | POA: Insufficient documentation

## 2016-07-28 DIAGNOSIS — R0789 Other chest pain: Secondary | ICD-10-CM | POA: Diagnosis present

## 2016-07-28 LAB — I-STAT TROPONIN, ED: Troponin i, poc: 0 ng/mL (ref 0.00–0.08)

## 2016-07-28 LAB — CBC
HCT: 43.2 % (ref 36.0–46.0)
Hemoglobin: 14.6 g/dL (ref 12.0–15.0)
MCH: 30.2 pg (ref 26.0–34.0)
MCHC: 33.8 g/dL (ref 30.0–36.0)
MCV: 89.4 fL (ref 78.0–100.0)
PLATELETS: 257 10*3/uL (ref 150–400)
RBC: 4.83 MIL/uL (ref 3.87–5.11)
RDW: 13 % (ref 11.5–15.5)
WBC: 12 10*3/uL — AB (ref 4.0–10.5)

## 2016-07-28 LAB — BASIC METABOLIC PANEL
Anion gap: 9 (ref 5–15)
BUN: 14 mg/dL (ref 6–20)
CO2: 24 mmol/L (ref 22–32)
CREATININE: 0.96 mg/dL (ref 0.44–1.00)
Calcium: 11.2 mg/dL — ABNORMAL HIGH (ref 8.9–10.3)
Chloride: 107 mmol/L (ref 101–111)
GFR calc Af Amer: >60 mL/min (ref >60)
GLUCOSE: 146 mg/dL — AB (ref 65–99)
Potassium: 3.9 mmol/L (ref 3.5–5.1)
SODIUM: 140 mmol/L (ref 135–145)

## 2016-07-28 MED ORDER — PANTOPRAZOLE SODIUM 40 MG PO TBEC
40.0000 mg | DELAYED_RELEASE_TABLET | Freq: Once | ORAL | Status: AC
  Administered 2016-07-28: 40 mg via ORAL
  Filled 2016-07-28: qty 1

## 2016-07-28 MED ORDER — PANTOPRAZOLE SODIUM 20 MG PO TBEC: 20.0000 mg | DELAYED_RELEASE_TABLET | Freq: Every day | ORAL | 2 refills | Status: DC

## 2016-07-28 MED ORDER — GI COCKTAIL ~~LOC~~
30.0000 mL | Freq: Once | ORAL | Status: AC
  Administered 2016-07-28: 30 mL via ORAL
  Filled 2016-07-28: qty 30

## 2016-07-28 NOTE — ED Provider Notes (Addendum)
[facility] DEPT Provider Note   CSN: [id number] Arrival date & time: 07/28/16  1122     History   Chief Complaint Chief Complaint  Patient presents with  . Chest Pain    HPI Stacey Colon is a 55 y.o. female.  She states that she has had upper abdominal pain for 2 weeks, that became worse yesterday and now radiates to the mid back. Also has a sensation of dull discomfort in her mid lower anterior chest. She has been using Tums, without relief. She does not smoke cigarettes. She states that she is hungry at this time. She had not eaten earlier because she was worried it would worsen her pain. She denies nausea, vomiting, fever, chills, cough, weakness or dizziness.  HPI  Past Medical History:  Diagnosis Date  . Bell's palsy   . Crohn disease ([place])   . Fibroids   . Kidney stone   . Sarcoidosis [facility])     Patient Active Problem List   Diagnosis Date Noted  . Bell's palsy 11/20/2011  . Lower back pain 11/20/2011  . HTN (hypertension) 11/19/2011  . ARF (acute renal failure) ([place]) 11/19/2011    Past Surgical History:  Procedure Laterality Date  . APPENDECTOMY    . CESAREAN SECTION    . CHOLECYSTECTOMY    . LITHOTRIPSY    . TONSILLECTOMY    . UTERINE FIBROID EMBOLIZATION      OB History    No data available       Home Medications    Prior to Admission medications   Medication Sig Start Date End Date Taking? Authorizing Provider  metFORMIN (GLUCOPHAGE-XR) 500 MG 24 hr tablet Take 500 mg by mouth daily with breakfast.   Yes Historical Provider, MD  pravastatin (PRAVACHOL) 40 MG tablet Take 40 mg by mouth every evening.   Yes Historical Provider, MD  traMADol (ULTRAM) 50 MG tablet Take 50 mg by mouth every 6 (six) hours as needed for pain.   Yes Historical Provider, MD  HYDROcodone-acetaminophen (NORCO/VICODIN) 5-325 MG per tablet Take 1-2 tablets by mouth every 6 (six) hours as needed for moderate pain. Patient not taking: Reported on 07/28/2016 12/18/14   Fredia Sorrow, MD    Family History History reviewed. No pertinent family history.  Social History Social History  Substance Use Topics  . Smoking status: Never Smoker  . Smokeless tobacco: Never Used  . Alcohol use No     Allergies   Ambien [zolpidem tartrate] and Nubain [nalbuphine hcl]   Review of Systems Review of Systems  All other systems reviewed and are negative.    Physical Exam Updated Vital Signs BP 144/84   Pulse 110   Temp 98.9 F (37.2 C) (Oral)   Resp 22   Wt 184 lb (83.5 kg)   SpO2 98%   BMI 31.58 kg/m   Physical Exam  Constitutional: She is oriented to person, place, and time. She appears well-developed and well-nourished.  HENT:  Head: Normocephalic and atraumatic.  Eyes: Conjunctivae and EOM are normal. Pupils are equal, round, and reactive to light.  Neck: Normal range of motion and phonation normal. Neck supple.  Cardiovascular: Normal rate and regular rhythm.   Pulmonary/Chest: Effort normal and breath sounds normal. She exhibits no tenderness.  Abdominal: Soft. She exhibits no distension. There is no tenderness (Epigastric, mild). There is no guarding.  Musculoskeletal: Normal range of motion.  Neurological: She is alert and oriented to person, place, and time. She exhibits normal muscle tone.  Skin: Skin is warm and dry.  Psychiatric: She has a normal mood and affect. Her behavior is normal. Judgment and thought content normal.  Nursing note and vitals reviewed.    ED Treatments / Results  Labs (all labs ordered are listed, but only abnormal results are displayed) Labs Reviewed  BASIC METABOLIC PANEL - Abnormal; Notable for the following:       Result Value   Glucose, Bld 146 (*)    Calcium 11.2 (*)    All other components within normal limits  CBC - Abnormal; Notable for the following:    WBC 12.0 (*)    All other components within normal limits  I-STAT TROPOININ, ED    EKG  EKG Interpretation  Date/Time:  Wednesday  July 28 2016 11:26:18 EDT Ventricular Rate:  130 PR Interval:  118 QRS Duration: 96 QT Interval:  312 QTC Calculation: 459 R Axis:   45 Text Interpretation:  Sinus tachycardia Otherwise normal ECG since last tracing no significant change Confirmed by Eulis Foster  MD, Deneise Getty [phone]) on 07/28/2016 4:22:25 PM       Radiology Dg Chest 2 View  Result Date: 07/28/2016 CLINICAL DATA:  LEFT chest, arm, and neck pain, recent cold symptoms, history sarcoidosis EXAM: CHEST  2 VIEW COMPARISON:  04/11/2010 FINDINGS: Normal heart size, mediastinal contours, and pulmonary vascularity. Calcified mediastinal and RIGHT hilar lymph nodes. Scarring in upper lobes bilaterally, stable. No acute infiltrate, pleural effusion, or pneumothorax. Bones unremarkable. IMPRESSION: Granulomatous disease changes and upper lobe scarring compatible with history of sarcoidosis, stable. No acute abnormalities. Electronically Signed   By: Lavonia Dana M.D.   On: 07/28/2016 12:31    Procedures Procedures (including critical care time)  Medications Ordered in ED Medications  pantoprazole (PROTONIX) EC tablet 40 mg (40 mg Oral Given 07/28/16 1644)  gi cocktail (Maalox,Lidocaine,Donnatal) (30 mLs Oral Given 07/28/16 1644)     Initial Impression / Assessment and Plan / ED Course  I have reviewed the triage vital signs and the nursing notes.  Pertinent labs & imaging results that were available during my care of the patient were reviewed by me and considered in my medical decision making (see chart for details).  Clinical Course  Value Comment By Time  ED EKG within 10 minutes (Reviewed) Daleen Bo, MD 09/06 1621  DG Chest 2 View (Reviewed) Daleen Bo, MD 09/06 1621  DG Chest 2 View CXR normal Daleen Bo, MD 09/06 1621   EKG with sinus tachycardia Daleen Bo, MD 09/06 1622    Medications  pantoprazole (PROTONIX) EC tablet 40 mg (40 mg Oral Given 07/28/16 1644)  gi cocktail (Maalox,Lidocaine,Donnatal) (30 mLs Oral  Given 07/28/16 1644)    Patient Vitals for the past 24 hrs:  BP Temp Temp src Pulse Resp SpO2 Weight  07/28/16 1630 144/84 - - 110 22 98 % -  07/28/16 1615 147/84 - - 103 16 97 % -  07/28/16 1605 131/56 - - 101 18 98 % -  07/28/16 1600 131/56 - - 102 (!) 56 99 % -  07/28/16 1545 136/78 - - 104 22 98 % -  07/28/16 1530 137/80 - - 107 21 96 % -  07/28/16 1524 135/93 - - 100 18 99 % -  07/28/16 1302 135/79 98.9 F (37.2 C) Oral 111 - 99 % -  07/28/16 1131 143/90 99 F (37.2 C) Oral (!) 128 20 95 % 184 lb (83.5 kg)    5:50 PM Reevaluation with update and discussion. After initial assessment and  treatment, an updated evaluation reveals She states that her pain is resolved at this time. She has no further complaints. Findings discussed with the patient and all questions were answered.Daleen Bo L    Final Clinical Impressions(s) / ED Diagnoses   Final diagnoses:  PUD (peptic ulcer disease)   Subacute epigastric discomfort radiating to the back, and mid lower chest, consistent with reflux type pain. Consideration for cardiac disorders, felt to be low risk with reassuring EKG and troponin. Remainder of the examination and evaluation is also normal. Doubt ACS, PE, pneumonia, or impending vascular collapse.  Nursing Notes Reviewed/ Care Coordinated Applicable Imaging Reviewed Interpretation of Laboratory Data incorporated into ED treatment  The patient appears reasonably screened and/or stabilized for discharge and I doubt any other medical condition or other [facility] requiring further screening, evaluation, or treatment in the ED at this time prior to discharge.  Plan: Home Medications- continue; Home Treatments- rest; return here if the recommended treatment, does not improve the symptoms; Recommended follow up- PCP 1-2 weeks   New Prescriptions New Prescriptions   No medications on file     Daleen Bo, MD 07/28/16 [place], MD 07/28/16 1754

## 2016-07-28 NOTE — ED Notes (Signed)
MD at bedside. 

## 2016-07-28 NOTE — ED Notes (Signed)
Pt d/c home via w/c with sister.

## 2016-07-28 NOTE — ED Notes (Signed)
Pt's mother came to triage desk stating "my daughter is going to pass out--is there anything you can do?" pt's vital signs rechecked and pt reassured, given water to drink.

## 2016-07-28 NOTE — ED Triage Notes (Signed)
Pt reports sudden onset of CP last night described as heart burn. Took 4 tums and pepto with no relief. At 3am pain worsened and radiated to left jaw. Pts mother with hx of heart disease.

## 2016-07-28 NOTE — ED Notes (Signed)
Santiago Glad, RN spoke with pt and family pt reassured no complaints noted at this time

## 2017-04-19 ENCOUNTER — Emergency Department (HOSPITAL_COMMUNITY): Payer: Medicaid Other

## 2017-04-19 ENCOUNTER — Encounter (HOSPITAL_COMMUNITY): Payer: Self-pay

## 2017-04-19 ENCOUNTER — Emergency Department (HOSPITAL_COMMUNITY)
Admission: EM | Admit: 2017-04-19 | Discharge: 2017-04-19 | Disposition: A | Discharge status: Home / Self Care | Payer: Medicaid Other | Attending: Emergency Medicine | Admitting: Emergency Medicine

## 2017-04-19 DIAGNOSIS — I10 Essential (primary) hypertension: Secondary | ICD-10-CM | POA: Diagnosis not present

## 2017-04-19 DIAGNOSIS — R0602 Shortness of breath: Secondary | ICD-10-CM | POA: Insufficient documentation

## 2017-04-19 DIAGNOSIS — I808 Phlebitis and thrombophlebitis of other sites: Secondary | ICD-10-CM

## 2017-04-19 DIAGNOSIS — M7989 Other specified soft tissue disorders: Secondary | ICD-10-CM | POA: Diagnosis present

## 2017-04-19 DIAGNOSIS — M79601 Pain in right arm: Secondary | ICD-10-CM

## 2017-04-19 DIAGNOSIS — R110 Nausea: Secondary | ICD-10-CM | POA: Insufficient documentation

## 2017-04-19 LAB — BASIC METABOLIC PANEL
ANION GAP: 7 (ref 5–15)
BUN: 18 mg/dL (ref 6–20)
CALCIUM: 9 mg/dL (ref 8.9–10.3)
CO2: 21 mmol/L — ABNORMAL LOW (ref 22–32)
Chloride: 109 mmol/L (ref 101–111)
Creatinine, Ser: 1.13 mg/dL — ABNORMAL HIGH (ref 0.44–1.00)
GFR calc Af Amer: >60 mL/min (ref >60)
GFR, EST NON AFRICAN AMERICAN: 53 mL/min — AB (ref >60)
GLUCOSE: 83 mg/dL (ref 65–99)
Potassium: 3.9 mmol/L (ref 3.5–5.1)
Sodium: 137 mmol/L (ref 135–145)

## 2017-04-19 LAB — CBC
HCT: 38.2 % (ref 36.0–46.0)
HEMOGLOBIN: 12.8 g/dL (ref 12.0–15.0)
MCH: 29.8 pg (ref 26.0–34.0)
MCHC: 33.5 g/dL (ref 30.0–36.0)
MCV: 88.8 fL (ref 78.0–100.0)
Platelets: 245 10*3/uL (ref 150–400)
RBC: 4.3 MIL/uL (ref 3.87–5.11)
RDW: 13.2 % (ref 11.5–15.5)
WBC: 9.7 10*3/uL (ref 4.0–10.5)

## 2017-04-19 MED ORDER — IOPAMIDOL (ISOVUE-370) INJECTION 76%
INTRAVENOUS | Status: AC
  Administered 2017-04-19: 100 mL
  Filled 2017-04-19: qty 100

## 2017-04-19 MED ORDER — ONDANSETRON 4 MG PO TBDP: 4.0000 mg | ORAL_TABLET | Freq: Three times a day (TID) | ORAL | 0 refills | Status: DC | PRN

## 2017-04-19 NOTE — ED Provider Notes (Signed)
[facility] DEPT Provider Note   CSN: [id number] Arrival date & time: 04/19/17  1808     History   Chief Complaint Chief Complaint  Patient presents with  . Shortness of Breath  . Abnormal Lab    HPI Stacey Colon is a 56 y.o. female.  Pt presents to the ED b/c her doctor told her to come and get a CT of her chest and right UE [place] to r/o dvt.  The pt was bitten and scratched by her cat 3 weeks ago.  She has been on abx since then.  The cellulitis that she developed has improved.  She has minimal swelling in her right arm now.  No cp.  No sob.  Her doctor did a d-dimer test yesterday and pt was called today telling her that her level was high and she needed to come here.  She did say that the antibiotics were causing her to feel nauseous in the morning.       Past Medical History:  Diagnosis Date  . Bell's palsy   . Crohn disease ([place])   . Fibroids   . Kidney stone   . Sarcoidosis     Patient Active Problem List   Diagnosis Date Noted  . Bell's palsy 11/20/2011  . Lower back pain 11/20/2011  . HTN (hypertension) 11/19/2011  . ARF (acute renal failure) ([place]) 11/19/2011    Past Surgical History:  Procedure Laterality Date  . APPENDECTOMY    . CESAREAN SECTION    . CHOLECYSTECTOMY    . LITHOTRIPSY    . TONSILLECTOMY    . UTERINE FIBROID EMBOLIZATION      OB History    No data available       Home Medications    Prior to Admission medications   Medication Sig Start Date End Date Taking? Authorizing Provider  HYDROcodone-acetaminophen (NORCO/VICODIN) 5-325 MG per tablet Take 1-2 tablets by mouth every 6 (six) hours as needed for moderate pain. Patient not taking: Reported on 07/28/2016 12/18/14   Fredia Sorrow, MD  metFORMIN (GLUCOPHAGE-XR) 500 MG 24 hr tablet Take 500 mg by mouth daily with breakfast.    [provider]  ondansetron (ZOFRAN ODT) 4 MG disintegrating tablet Take 1 tablet (4 mg total) by mouth every 8 (eight) hours as needed.  04/19/17   Isla Pence, MD  pantoprazole (PROTONIX) 20 MG tablet Take 1 tablet (20 mg total) by mouth daily. 07/28/16   Daleen Bo, MD  pravastatin (PRAVACHOL) 40 MG tablet Take 40 mg by mouth every evening.    [provider]  traMADol (ULTRAM) 50 MG tablet Take 50 mg by mouth every 6 (six) hours as needed for pain.    [provider]    Family History History reviewed. No pertinent family history.  Social History Social History  Substance Use Topics  . Smoking status: Never Smoker  . Smokeless tobacco: Never Used  . Alcohol use No     Allergies   Ambien [zolpidem tartrate] and Nubain [nalbuphine hcl]   Review of Systems Review of Systems  Gastrointestinal: Positive for nausea.  All other systems reviewed and are negative.    Physical Exam Updated Vital Signs BP 124/69   Pulse 87   Temp 98.8 F (37.1 C)   Resp 17   SpO2 98%   Physical Exam  Constitutional: She is oriented to person, place, and time. She appears well-developed and well-nourished.  HENT:  Head: Normocephalic and atraumatic.  Right Ear: External ear normal.  Left Ear: External ear normal.  Nose: Nose normal.  Mouth/Throat: Oropharynx is clear and moist.  Eyes: Conjunctivae and EOM are normal. Pupils are equal, round, and reactive to light.  Neck: Normal range of motion. Neck supple.  Cardiovascular: Normal rate, regular rhythm, normal heart sounds and intact distal pulses.   Pulmonary/Chest: Effort normal and breath sounds normal.  Abdominal: Soft. Bowel sounds are normal.  Musculoskeletal:  No swelling to right arm.  No residual cellulitis.  Superficial thrombophlebitis to right wrist.  Neurological: She is alert and oriented to person, place, and time.  Skin: Skin is warm.  Psychiatric: She has a normal mood and affect. Her behavior is normal. Judgment and thought content normal.  Nursing note and vitals reviewed.    ED Treatments / Results  Labs (all labs ordered  are listed, but only abnormal results are displayed) Labs Reviewed  BASIC METABOLIC PANEL - Abnormal; Notable for the following:       Result Value   CO2 21 (*)    Creatinine, Ser 1.13 (*)    GFR calc non Af Amer 53 (*)    All other components within normal limits  CBC    EKG  EKG Interpretation  Date/Time:  Tuesday Apr 19 2017 18:28:01 EDT Ventricular Rate:  97 PR Interval:  142 QRS Duration: 90 QT Interval:  348 QTC Calculation: 441 R Axis:   76 Text Interpretation:  Normal sinus rhythm Normal ECG Confirmed by Isla Pence [phone]) on 04/19/2017 9:37:19 PM       Radiology Dg Chest 2 View  Result Date: 04/19/2017 CLINICAL DATA:  56 year old female with shortness of breath. EXAM: CHEST  2 VIEW COMPARISON:  Chest radiograph dated 07/28/2016 FINDINGS: The lungs are clear. There is no pleural effusion or pneumothorax. The cardiac silhouette is within normal limits. Multiple calcified granuloma in the hilar region noted consistent with history of sarcoidosis. No acute osseous pathology. Right upper quadrant cholecystectomy clips. IMPRESSION: No acute cardiopulmonary process. Electronically Signed   By: Anner Crete M.D.   On: 04/19/2017 19:01   Ct Angio Chest Pe W Or Wo Contrast  Result Date: 04/19/2017 CLINICAL DATA:  Dyspnea times several days.  Elevated D-dimer. EXAM: CT ANGIOGRAPHY CHEST WITH CONTRAST TECHNIQUE: Multidetector CT imaging of the chest was performed using the standard protocol during bolus administration of intravenous contrast. Multiplanar CT image reconstructions and MIPs were obtained to evaluate the vascular anatomy. CONTRAST:  60 cc Isovue 370 IV COMPARISON:  Same day CXR, CT abdomen and pelvis 11/18/2016 FINDINGS: Cardiovascular: Normal branch pattern of the great vessels. No aortic aneurysm or dissection. There is aortic atherosclerosis. No acute pulmonary embolus. Top normal size cardiac chambers. No pericardial effusion. Mediastinum/Nodes: Calcified  mediastinal lymph nodes consistent with old granulomatous disease. Lungs/Pleura: Ill-defined nodular masslike opacity the right upper lobe may reflect sequela of granulomatous disease. It is however noncalcified in appearance and measures 2.2 x 1.3 x 1.1 cm, series 9, image 84 and axial series 8, image 23 and the possibility of neoplasm is not entirely excluded. No effusion. No pneumothorax. Upper Abdomen: No acute abnormality.  Cholecystectomy. Musculoskeletal: No chest wall abnormality. No acute or suspicious osseous abnormalities. Degenerative changes are noted along the dorsal spine. Review of the MIP images confirms the above findings. IMPRESSION: 1. No acute pulmonary embolus. 2. Ill-defined masslike opacity in the right upper lobe measuring 2.2 x 1.3 x 1.1 cm may be sequela old granulomatous disease given calcified lymph nodes present within the mediastinum or potentially scarring. Neoplasm is  not entirely excluded. Consider one of the following in 3 months for both low-risk and high-risk individuals: (a) repeat chest CT, (b) follow-up PET-CT, or (c) tissue sampling. This recommendation follows the consensus statement: Guidelines for Management of Incidental Pulmonary Nodules Detected on CT Images: From the Fleischner Society 2017; Radiology 2017; 284:228-243. Electronically Signed   By: Ashley Royalty M.D.   On: 04/19/2017 21:33    Procedures Procedures (including critical care time)  Medications Ordered in ED Medications  iopamidol (ISOVUE-370) 76 % injection (100 mLs  Contrast Given 04/19/17 2047)     Initial Impression / Assessment and Plan / ED Course  I have reviewed the triage vital signs and the nursing notes.  Pertinent labs & imaging results that were available during my care of the patient were reviewed by me and considered in my medical decision making (see chart for details).     Pt's CT show no PE.  It does show an abnormality in the RUL.  Pt said she's been told that in the past  and that it is from sarcoidosis.   It is too late to get a vascular US [facility], so I ordered one for tomorrow morning.  I have a low suspicion that it will be positive, so I did not give her any anticoagulation.  Pt understands and knows to return if worse.  Final Clinical Impressions(s) / ED Diagnoses   Final diagnoses:  SOB (shortness of breath)  Pain of right upper extremity  Superficial thrombophlebitis of right upper extremity    New Prescriptions New Prescriptions   ONDANSETRON (ZOFRAN ODT) 4 MG DISINTEGRATING TABLET    Take 1 tablet (4 mg total) by mouth every 8 (eight) hours as needed.     Isla Pence, MD 04/19/17 2224

## 2017-04-19 NOTE — ED Triage Notes (Signed)
Pt was sent here by her PCP for elevated d-dimer (0.98). She has had cellulitis X1 week and has been on abx. MD wants her to have CT angio as well as upper extremity doppler. Pt reports shortness of breath, no acute distress noted.

## 2017-04-20 ENCOUNTER — Ambulatory Visit (HOSPITAL_COMMUNITY)
Admission: RE | Admit: 2017-04-20 | Discharge: 2017-04-20 | Disposition: A | Discharge status: Home / Self Care | Payer: Medicaid Other | Source: Ambulatory Visit | Attending: Emergency Medicine | Admitting: Emergency Medicine

## 2017-04-20 DIAGNOSIS — M7989 Other specified soft tissue disorders: Secondary | ICD-10-CM

## 2017-04-20 DIAGNOSIS — M79609 Pain in unspecified limb: Secondary | ICD-10-CM | POA: Diagnosis not present

## 2017-04-20 DIAGNOSIS — M79601 Pain in right arm: Secondary | ICD-10-CM | POA: Diagnosis not present

## 2017-04-20 NOTE — Progress Notes (Signed)
*  Preliminary Results* Right upper extremity venous duplex completed. Right upper extremity is negative for deep and superficial vein thrombosis.  04/20/2017 8:58 AM  Maudry Mayhew, BS, RVT, RDCS, RDMS

## 2017-04-24 ENCOUNTER — Emergency Department (HOSPITAL_BASED_OUTPATIENT_CLINIC_OR_DEPARTMENT_OTHER): Payer: Medicaid Other

## 2017-04-24 ENCOUNTER — Encounter (HOSPITAL_BASED_OUTPATIENT_CLINIC_OR_DEPARTMENT_OTHER): Payer: Self-pay | Admitting: *Deleted

## 2017-04-24 ENCOUNTER — Emergency Department (HOSPITAL_BASED_OUTPATIENT_CLINIC_OR_DEPARTMENT_OTHER)
Admission: EM | Admit: 2017-04-24 | Discharge: 2017-04-24 | Disposition: A | Discharge status: Home / Self Care | Payer: Medicaid Other | Attending: Emergency Medicine | Admitting: Emergency Medicine

## 2017-04-24 DIAGNOSIS — Z7902 Long term (current) use of antithrombotics/antiplatelets: Secondary | ICD-10-CM | POA: Diagnosis not present

## 2017-04-24 DIAGNOSIS — I10 Essential (primary) hypertension: Secondary | ICD-10-CM | POA: Insufficient documentation

## 2017-04-24 DIAGNOSIS — N201 Calculus of ureter: Secondary | ICD-10-CM | POA: Diagnosis not present

## 2017-04-24 DIAGNOSIS — N200 Calculus of kidney: Secondary | ICD-10-CM | POA: Diagnosis present

## 2017-04-24 LAB — COMPREHENSIVE METABOLIC PANEL
ALK PHOS: 58 U/L (ref 38–126)
ALT: 54 U/L (ref 14–54)
ANION GAP: 9 (ref 5–15)
AST: 45 U/L — ABNORMAL HIGH (ref 15–41)
Albumin: 4.1 g/dL (ref 3.5–5.0)
BUN: 18 mg/dL (ref 6–20)
CALCIUM: 9.2 mg/dL (ref 8.9–10.3)
CHLORIDE: 105 mmol/L (ref 101–111)
CO2: 21 mmol/L — AB (ref 22–32)
Creatinine, Ser: 0.92 mg/dL (ref 0.44–1.00)
GFR calc non Af Amer: >60 mL/min (ref >60)
Glucose, Bld: 169 mg/dL — ABNORMAL HIGH (ref 65–99)
POTASSIUM: 3.9 mmol/L (ref 3.5–5.1)
Sodium: 135 mmol/L (ref 135–145)
Total Bilirubin: 0.5 mg/dL (ref 0.3–1.2)
Total Protein: 7.8 g/dL (ref 6.5–8.1)

## 2017-04-24 LAB — CBC WITH DIFFERENTIAL/PLATELET
BASOS PCT: 1 %
Basophils Absolute: 0.1 10*3/uL (ref 0.0–0.1)
Eosinophils Absolute: 0.4 10*3/uL (ref 0.0–0.7)
Eosinophils Relative: 5 %
HCT: 37.8 % (ref 36.0–46.0)
Hemoglobin: 12.9 g/dL (ref 12.0–15.0)
LYMPHS PCT: 35 %
Lymphs Abs: 3.2 10*3/uL (ref 0.7–4.0)
MCH: 30.1 pg (ref 26.0–34.0)
MCHC: 34.1 g/dL (ref 30.0–36.0)
MCV: 88.3 fL (ref 78.0–100.0)
Monocytes Absolute: 0.9 10*3/uL (ref 0.1–1.0)
Monocytes Relative: 10 %
NEUTROS ABS: 4.7 10*3/uL (ref 1.7–7.7)
Neutrophils Relative %: 51 %
PLATELETS: 248 10*3/uL (ref 150–400)
RBC: 4.28 MIL/uL (ref 3.87–5.11)
RDW: 13.3 % (ref 11.5–15.5)
WBC: 9.2 10*3/uL (ref 4.0–10.5)

## 2017-04-24 LAB — URINALYSIS, ROUTINE W REFLEX MICROSCOPIC
Bilirubin Urine: NEGATIVE
GLUCOSE, UA: NEGATIVE mg/dL
Ketones, ur: NEGATIVE mg/dL
Nitrite: NEGATIVE
Protein, ur: 30 mg/dL — AB
SPECIFIC GRAVITY, URINE: 1.022 (ref 1.005–1.030)
pH: 5.5 (ref 5.0–8.0)

## 2017-04-24 LAB — URINALYSIS, MICROSCOPIC (REFLEX)

## 2017-04-24 MED ORDER — OXYCODONE-ACETAMINOPHEN 5-325 MG PO TABS: 2.0000 | ORAL_TABLET | ORAL | 0 refills | Status: DC | PRN

## 2017-04-24 MED ORDER — MORPHINE SULFATE (PF) 4 MG/ML IV SOLN
4.0000 mg | Freq: Once | INTRAVENOUS | Status: AC
  Administered 2017-04-24: 4 mg via INTRAVENOUS
  Filled 2017-04-24: qty 1

## 2017-04-24 MED ORDER — TAMSULOSIN HCL 0.4 MG PO CAPS: 0.4000 mg | ORAL_CAPSULE | Freq: Every day | ORAL | 0 refills | Status: AC

## 2017-04-24 MED ORDER — SODIUM CHLORIDE 0.9 % IV BOLUS (SEPSIS)
1000.0000 mL | Freq: Once | INTRAVENOUS | Status: AC
  Administered 2017-04-24: 1000 mL via INTRAVENOUS

## 2017-04-24 MED ORDER — ONDANSETRON HCL 4 MG/2ML IJ SOLN
4.0000 mg | Freq: Once | INTRAMUSCULAR | Status: AC
  Administered 2017-04-24: 4 mg via INTRAVENOUS
  Filled 2017-04-24: qty 2

## 2017-04-24 NOTE — ED Triage Notes (Signed)
Right flank pain with hx of kidney stones x 20 minutes PTA.  No med PTA

## 2017-04-24 NOTE — Discharge Instructions (Signed)
You have a right ureteral stone.   Please take percocet and flomax as prescribed.  Contact urologist tomorrow for re-evaluation.  Return if fever, uncontrollable pain and or symptoms worsen

## 2017-04-24 NOTE — ED Provider Notes (Signed)
[facility] DEPT [facility] Provider Note   CSN: [id number] Arrival date & time: 04/24/17  1745  By signing my name below, I, Marcello Moores, attest that this documentation has been prepared under the direction and in the presence of Carmon Sails, PA-C. Electronically Signed: Marcello Moores, ED Scribe. 04/24/17. 6:57 PM.  History   Chief Complaint Chief Complaint  Patient presents with  . Nephrolithiasis   The history is provided by the patient. No language interpreter was used.   HPI Comments: Stacey Colon is a 56 y.o. female, with a h/x of nephrolithiasis, presents to the Emergency Department complaining of right flank pain that radiates to her groin that began an hour and a half ago PTA associated with hematuria. The pt has not tried any medications or methods to lessen her pain. Patient notes symptoms are typical of her previous kidney stones.  She also states that she is on antibiotics (one month) for cellulitis caused by a cat scratch. Patient has long h/o recurrent kidney stones. The pt's urology specialists are within the [facility] practice. Her PMSx includes 3 surgeries to remove kidney stones in December of 2017.The pt denies fall. The pt does not have associated dysuria, vaginal discharge, and fever.   Past Medical History:  Diagnosis Date  . Bell's palsy   . Crohn disease ([place])   . Fibroids   . Kidney stone   . Sarcoidosis     Patient Active Problem List   Diagnosis Date Noted  . Bell's palsy 11/20/2011  . Lower back pain 11/20/2011  . HTN (hypertension) 11/19/2011  . ARF (acute renal failure) ([place]) 11/19/2011    Past Surgical History:  Procedure Laterality Date  . APPENDECTOMY    . CESAREAN SECTION    . CHOLECYSTECTOMY    . LITHOTRIPSY    . TONSILLECTOMY    . UTERINE FIBROID EMBOLIZATION      OB History    No data available       Home Medications    Prior to Admission medications   Medication Sig Start Date End Date Taking? Authorizing  Provider  HYDROcodone-acetaminophen (NORCO/VICODIN) 5-325 MG per tablet Take 1-2 tablets by mouth every 6 (six) hours as needed for moderate pain. Patient not taking: Reported on 07/28/2016 12/18/14   Fredia Sorrow, MD  metFORMIN (GLUCOPHAGE-XR) 500 MG 24 hr tablet Take 500 mg by mouth daily with breakfast.    [provider]  ondansetron (ZOFRAN ODT) 4 MG disintegrating tablet Take 1 tablet (4 mg total) by mouth every 8 (eight) hours as needed. 04/19/17   Isla Pence, MD  oxyCODONE-acetaminophen (PERCOCET/ROXICET) 5-325 MG tablet Take 2 tablets by mouth every 4 (four) hours as needed for severe pain. 04/24/17   Kinnie Feil, PA-C  pantoprazole (PROTONIX) 20 MG tablet Take 1 tablet (20 mg total) by mouth daily. 07/28/16   Daleen Bo, MD  pravastatin (PRAVACHOL) 40 MG tablet Take 40 mg by mouth every evening.    [provider]  tamsulosin (FLOMAX) 0.4 MG CAPS capsule Take 1 capsule (0.4 mg total) by mouth daily. 04/24/17 05/04/17  Kinnie Feil, PA-C  traMADol (ULTRAM) 50 MG tablet Take 50 mg by mouth every 6 (six) hours as needed for pain.    [provider]    Family History History reviewed. No pertinent family history.  Social History Social History  Substance Use Topics  . Smoking status: Never Smoker  . Smokeless tobacco: Never Used  . Alcohol use No     Allergies  Ambien [zolpidem tartrate] and Nubain [nalbuphine hcl]   Review of Systems Review of Systems  Constitutional: Negative for fever.  Gastrointestinal: Positive for abdominal pain and nausea.  Genitourinary: Positive for hematuria and pelvic pain. Negative for dysuria and vaginal discharge.  Musculoskeletal: Positive for back pain.     Physical Exam Updated Vital Signs BP 131/82 (BP Location: Right Arm)   Pulse 86   Temp 98.4 F (36.9 C) (Oral)   Resp 17   Ht 5\' 4"  (1.626 m)   Wt 83.9 kg (185 lb)   SpO2 96%   BMI 31.76 kg/m   Physical Exam  Constitutional: She is  oriented to person, place, and time. She appears well-developed and well-nourished. No distress.  HENT:  Head: Normocephalic and atraumatic.  Nose: Nose normal.  Mouth/Throat: Oropharynx is clear and moist. No oropharyngeal exudate.  Eyes: Conjunctivae and EOM are normal. Pupils are equal, round, and reactive to light.  Neck: Normal range of motion. Neck supple.  Cardiovascular: Normal rate, regular rhythm, normal heart sounds and intact distal pulses.   No murmur heard. Pulmonary/Chest: Effort normal and breath sounds normal. She exhibits no tenderness.  Abdominal: Soft. Bowel sounds are normal. She exhibits no distension. There is tenderness (suprapubic) in the suprapubic area. There is CVA tenderness.  Right sided CVA tenderness  Musculoskeletal: Normal range of motion. She exhibits tenderness. She exhibits no deformity.  Lymphadenopathy:    She has no cervical adenopathy.  Neurological: She is alert and oriented to person, place, and time. No sensory deficit.  Skin: Skin is warm and dry. Capillary refill takes less than 2 seconds.  Psychiatric: She has a normal mood and affect. Her behavior is normal. Judgment and thought content normal.  Nursing note and vitals reviewed.    ED Treatments / Results   DIAGNOSTIC STUDIES: Oxygen Saturation is 100% on RA, normal by my interpretation.   COORDINATION OF CARE: 6:49 PM-Discussed next steps with pt. Pt verbalized understanding and is agreeable with the plan.   Labs (all labs ordered are listed, but only abnormal results are displayed) Labs Reviewed  URINALYSIS, ROUTINE W REFLEX MICROSCOPIC - Abnormal; Notable for the following:       Result Value   APPearance CLOUDY (*)    Hgb urine dipstick LARGE (*)    Protein, ur 30 (*)    Leukocytes, UA MODERATE (*)    All other components within normal limits  URINALYSIS, MICROSCOPIC (REFLEX) - Abnormal; Notable for the following:    Bacteria, UA FEW (*)    Squamous Epithelial / LPF 0-5  (*)    All other components within normal limits  COMPREHENSIVE METABOLIC PANEL - Abnormal; Notable for the following:    CO2 21 (*)    Glucose, Bld 169 (*)    AST 45 (*)    All other components within normal limits  CBC WITH DIFFERENTIAL/PLATELET    EKG  EKG Interpretation None       Radiology Ct Renal Stone Study  Result Date: 04/24/2017 CLINICAL DATA:  Acute onset of right flank pain.  Initial encounter. EXAM: CT ABDOMEN AND PELVIS WITHOUT CONTRAST TECHNIQUE: Multidetector CT imaging of the abdomen and pelvis was performed following the standard protocol without IV contrast. COMPARISON:  CT of the abdomen and pelvis performed 11/18/2016 FINDINGS: Lower chest: The visualized lung bases are grossly clear. The visualized portions of the mediastinum are unremarkable. Calcified nodes are noted about the distal esophagus. Hepatobiliary: The liver is unremarkable in appearance. The patient is status post  cholecystectomy, with clips noted at the gallbladder fossa. The common bile duct remains normal in caliber. Pancreas: The pancreas is within normal limits. Spleen: The spleen is unremarkable in appearance. Adrenals/Urinary Tract: The adrenal glands are unremarkable in appearance. Mild right-sided hydronephrosis is noted, with prominence of the right ureter along its entire course. An obstructing 5 x 4 mm stone is noted within the distal right ureter, just above the right vesicoureteral junction. Nonobstructing bilateral renal stones measure up to 4 mm in size. No significant perinephric stranding is seen. Stomach/Bowel: The stomach is unremarkable in appearance. The small bowel is within normal limits. The patient is status post appendectomy. The colon is unremarkable in appearance. Vascular/Lymphatic: Scattered calcification is seen along the abdominal aorta and its branches. The abdominal aorta is otherwise grossly unremarkable. The inferior vena cava is grossly unremarkable. No retroperitoneal  lymphadenopathy is seen. No pelvic sidewall lymphadenopathy is identified. Reproductive: The bladder is mildly distended and grossly unremarkable. The uterus contains small calcified uterine fibroids. No suspicious adnexal masses are seen. The ovaries appear grossly symmetric. Other: No additional soft tissue abnormalities are seen. Musculoskeletal: No acute osseous abnormalities are identified. Facet disease is noted at the lower lumbar spine. The visualized musculature is unremarkable in appearance. IMPRESSION: 1. Mild right-sided hydronephrosis, with an obstructing 5 x 4 mm stone at the distal right ureter, just above the right vesicoureteral junction. 2. Nonobstructing bilateral renal stones measure up to 4 mm in size. 3. Scattered aortic atherosclerosis. 4. Calcified nodes about the distal esophagus, reflecting remote granulomatous disease. 5. Small calcified uterine fibroids noted. Electronically Signed   By: Garald Balding M.D.   On: 04/24/2017 19:47    Procedures Procedures (including critical care time)  Medications Ordered in ED Medications  sodium chloride 0.9 % bolus 1,000 mL (0 mLs Intravenous Stopped 04/24/17 2107)  ondansetron (ZOFRAN) injection 4 mg (4 mg Intravenous Given 04/24/17 1920)  morphine 4 MG/ML injection 4 mg (4 mg Intravenous Given 04/24/17 1920)  morphine 4 MG/ML injection 4 mg (4 mg Intravenous Given 04/24/17 2041)  morphine 4 MG/ML injection 4 mg (4 mg Intravenous Given 04/24/17 2205)     Initial Impression / Assessment and Plan / ED Course  I have reviewed the triage vital signs and the nursing notes.  Pertinent labs & imaging results that were available during my care of the patient were reviewed by me and considered in my medical decision making (see chart for details).  Clinical Course as of Apr 25 105  Sun Apr 24, 2017  2113 IMPRESSION: 1. Mild right-sided hydronephrosis, with an obstructing 5 x 4 mm stone at the distal right ureter, just above the right  vesicoureteral junction. 2. Nonobstructing bilateral renal stones measure up to 4 mm in size. 3. Scattered aortic atherosclerosis. 4. Calcified nodes about the distal esophagus, reflecting remote granulomatous disease. 5. Small calcified uterine fibroids noted. CT Renal Stone Study [CG]  Mon Apr 25, 2017  0100 Leukocytes, UA: (!) MODERATE [CG]  0100 Hgb urine dipstick: (!) LARGE [CG]  0100 Bacteria, UA: (!) FEW [CG]  0100 Squamous Epithelial / LPF: (!) 0-5 [CG]  0100 Ca Oxalate Crys, UA: PRESENT [CG]    Clinical Course User Index [CG] Kinnie Feil, PA-C   56 year old female with pertinent past medical history of recurrent nephrolithiasis presents to ED with sudden onset right-sided flank pain associated with hematuria and nausea 20 minutes PTA. No associated fevers, vomiting, abdominal pain, vaginal discharge. Patient has had multiple lithotripsies with stenting for recurrent kidney  stones. Exam patient has suprapubic tenderness and right-sided CVA tenderness. No fever.   CT renal shows distal right ureter stone with mild hydronephrosis. Urinalysis not suggestive of infection today. Patient was given pain control and fluids in the ED. Pain was well controlled. Urology was consulted for further recommendations, they agree with discharge. Patient will be discharged with percocet, Flomax and follow up with her urologist. ED return precautions given, patient verbalized understanding and is agreeable with plan.  Patient, ED treatment and discharge plan was discussed with supervising physician who is agreeable with plan.   Final Clinical Impressions(s) / ED Diagnoses   Final diagnoses:  Ureterolithiasis    New Prescriptions Discharge Medication List as of 04/24/2017 10:09 PM    START taking these medications   Details  oxyCODONE-acetaminophen (PERCOCET/ROXICET) 5-325 MG tablet Take 2 tablets by mouth every 4 (four) hours as needed for severe pain., Starting Sun 04/24/2017, Print      tamsulosin (FLOMAX) 0.4 MG CAPS capsule Take 1 capsule (0.4 mg total) by mouth daily., Starting Sun 04/24/2017, Until Wed 05/04/2017, Print       I personally performed the services described in this documentation, which was scribed in my presence. The recorded information has been reviewed and is accurate.      Kinnie Feil, PA-C 04/25/17 0106    Tegeler, Gwenyth Allegra, MD 04/25/17 [phone]

## 2017-04-24 NOTE — ED Notes (Signed)
Alert, NAD, calm, interactive, resps e/u, speaking in clear complete sentences, no dyspnea noted, skin W&D, states, "hungry, wants to go home, plans to call her urologist in the morning". Family at [facility].  Pt updated by PA, pending call from GU MD.

## 2017-06-26 ENCOUNTER — Encounter (HOSPITAL_BASED_OUTPATIENT_CLINIC_OR_DEPARTMENT_OTHER): Payer: Self-pay

## 2017-06-26 ENCOUNTER — Emergency Department (HOSPITAL_BASED_OUTPATIENT_CLINIC_OR_DEPARTMENT_OTHER)
Admission: EM | Admit: 2017-06-26 | Discharge: 2017-06-26 | Disposition: A | Discharge status: Home / Self Care | Payer: Medicaid Other | Attending: Emergency Medicine | Admitting: Emergency Medicine

## 2017-06-26 DIAGNOSIS — R3129 Other microscopic hematuria: Secondary | ICD-10-CM | POA: Diagnosis not present

## 2017-06-26 DIAGNOSIS — Z79899 Other long term (current) drug therapy: Secondary | ICD-10-CM | POA: Diagnosis not present

## 2017-06-26 DIAGNOSIS — R1030 Lower abdominal pain, unspecified: Secondary | ICD-10-CM | POA: Diagnosis present

## 2017-06-26 DIAGNOSIS — Z7984 Long term (current) use of oral hypoglycemic drugs: Secondary | ICD-10-CM | POA: Insufficient documentation

## 2017-06-26 DIAGNOSIS — E119 Type 2 diabetes mellitus without complications: Secondary | ICD-10-CM | POA: Diagnosis not present

## 2017-06-26 DIAGNOSIS — N23 Unspecified renal colic: Secondary | ICD-10-CM | POA: Diagnosis not present

## 2017-06-26 HISTORY — DX: Type 2 diabetes mellitus without complications: E11.9

## 2017-06-26 LAB — URINALYSIS, ROUTINE W REFLEX MICROSCOPIC
BILIRUBIN URINE: NEGATIVE
Glucose, UA: NEGATIVE mg/dL
KETONES UR: NEGATIVE mg/dL
Leukocytes, UA: NEGATIVE
NITRITE: NEGATIVE
PROTEIN: NEGATIVE mg/dL
Specific Gravity, Urine: 1.023 (ref 1.005–1.030)
pH: 5.5 (ref 5.0–8.0)

## 2017-06-26 LAB — URINALYSIS, MICROSCOPIC (REFLEX): BACTERIA UA: NONE SEEN

## 2017-06-26 MED ORDER — KETOROLAC TROMETHAMINE 60 MG/2ML IM SOLN
40.0000 mg | Freq: Once | INTRAMUSCULAR | Status: AC
  Administered 2017-06-26: 39 mg via INTRAMUSCULAR
  Filled 2017-06-26: qty 2

## 2017-06-26 NOTE — ED Triage Notes (Signed)
Patient reports left flank that started today and associated with  nausea

## 2017-06-26 NOTE — ED Notes (Signed)
EDP into room, prior to RN assessment, see MD notes, pending orders.   

## 2017-06-26 NOTE — ED Provider Notes (Signed)
[facility] Provider Note   CSN: [id number] Arrival date & time: 06/26/17  1932  By signing my name below, I, Dora Sims, attest that this documentation has been prepared under the direction and in the presence of physician practitioner, Cardama, Grayce Sessions, MD. Electronically Signed: Dora Sims, Scribe. 06/26/2017. 10:30 PM.  History   Chief Complaint Chief Complaint  Patient presents with  . Flank Pain   The history is provided by the patient. No language interpreter was used.    HPI Comments: Stacey Colon is a 56 y.o. female with PMHx including acute renal failure, kidney stones and uterine fibroids who presents to the Emergency Department complaining of sudden onset, constant, waxing and waning left flank pain that began this morning. Patient rates the pain at 5/10 in severity currently and 8/10 at worst. She notes that her urine appeared darker than normal this morning. She also notes some associated nausea without vomiting. Patient took Flomax shortly after the onset of her flank pain but did not try any pain medications. She has had renal stents placed in the past and has had several kidney stones lithotripsied. Patient denies dysuria, hematuria, urinary retention, or any other associated symptoms.  Past Medical History:  Diagnosis Date  . Bell's palsy   . Crohn disease ([place])   . Diabetes mellitus without complication ([place])   . Fibroids   . Kidney stone   . Sarcoidosis     Patient Active Problem List   Diagnosis Date Noted  . Bell's palsy 11/20/2011  . Lower back pain 11/20/2011  . HTN (hypertension) 11/19/2011  . ARF (acute renal failure) ([place]) 11/19/2011    Past Surgical History:  Procedure Laterality Date  . APPENDECTOMY    . CESAREAN SECTION    . CHOLECYSTECTOMY    . LITHOTRIPSY    . TONSILLECTOMY    . UTERINE FIBROID EMBOLIZATION      OB History    No data available       Home Medications    Prior to Admission medications     Medication Sig Start Date End Date Taking? Authorizing Provider  metFORMIN (GLUCOPHAGE-XR) 500 MG 24 hr tablet Take 500 mg by mouth daily with breakfast.   Yes [provider]  pravastatin (PRAVACHOL) 40 MG tablet Take 40 mg by mouth every evening.   Yes [provider]  HYDROcodone-acetaminophen (NORCO/VICODIN) 5-325 MG per tablet Take 1-2 tablets by mouth every 6 (six) hours as needed for moderate pain. Patient not taking: Reported on 07/28/2016 12/18/14   Fredia Sorrow, MD  ondansetron (ZOFRAN ODT) 4 MG disintegrating tablet Take 1 tablet (4 mg total) by mouth every 8 (eight) hours as needed. 04/19/17   Isla Pence, MD  oxyCODONE-acetaminophen (PERCOCET/ROXICET) 5-325 MG tablet Take 2 tablets by mouth every 4 (four) hours as needed for severe pain. 04/24/17   Kinnie Feil, PA-C  pantoprazole (PROTONIX) 20 MG tablet Take 1 tablet (20 mg total) by mouth daily. 07/28/16   Daleen Bo, MD  traMADol (ULTRAM) 50 MG tablet Take 50 mg by mouth every 6 (six) hours as needed for pain.    [provider]    Family History No family history on file.  Social History Social History  Substance Use Topics  . Smoking status: Never Smoker  . Smokeless tobacco: Never Used  . Alcohol use No     Allergies   Ambien [zolpidem tartrate] and Nubain [nalbuphine hcl]   Review of Systems Review of Systems All other systems reviewed  and are negative for acute change except as noted in the HPI. Physical Exam Updated Vital Signs BP (!) 153/76   Pulse 98   Temp 98.2 F (36.8 C) (Oral)   Resp 16   Ht 5\' 4"  (1.626 m)   Wt 83.9 kg (185 lb)   SpO2 95%   BMI 31.76 kg/m   Physical Exam  Constitutional: She is oriented to person, place, and time. She appears well-developed and well-nourished. No distress.  HENT:  Head: Normocephalic and atraumatic.  Nose: Nose normal.  Eyes: Pupils are equal, round, and reactive to light. Conjunctivae and EOM are normal. Right eye  exhibits no discharge. Left eye exhibits no discharge. No scleral icterus.  Neck: Normal range of motion. Neck supple.  Cardiovascular: Normal rate and regular rhythm.  Exam reveals no gallop and no friction rub.   No murmur heard. Pulmonary/Chest: Effort normal and breath sounds normal. No stridor. No respiratory distress. She has no rales.  Abdominal: Soft. She exhibits no distension. There is no tenderness.  Musculoskeletal: She exhibits no edema or tenderness.  Neurological: She is alert and oriented to person, place, and time.  Skin: Skin is warm and dry. No rash noted. She is not diaphoretic. No erythema.  Psychiatric: She has a normal mood and affect.  Vitals reviewed.   ED Treatments / Results  Labs (all labs ordered are listed, but only abnormal results are displayed) Labs Reviewed  URINALYSIS, ROUTINE W REFLEX MICROSCOPIC - Abnormal; Notable for the following:       Result Value   Hgb urine dipstick MODERATE (*)    All other components within normal limits  URINALYSIS, MICROSCOPIC (REFLEX) - Abnormal; Notable for the following:    Squamous Epithelial / LPF 0-5 (*)    Non Squamous Epithelial PRESENT (*)    All other components within normal limits    EKG  EKG Interpretation None       Radiology No results found.  Procedures Procedures (including critical care time)  Emergency Focused Ultrasound Exam Limited Retroperitoneal Ultrasound of Kidneys and Bladder  Performed and interpreted by Dr. Leonette Monarch Focused abdominal ultrasound with both kidneys and bladder imaged in transverse and longitudinal planes in real-time. Indication: flank pain Findings: bilateral kidneys present, no shadowing, no anechoic areas Interpretation: no hydronephrosis visualized.  no stones or cysts visualized  Images archived electronically  CPT Code: [phone]  DIAGNOSTIC STUDIES: Oxygen Saturation is 96% on RA, adequate by my interpretation.    COORDINATION OF CARE: 10:28 PM Discussed  treatment plan with pt at bedside and pt agreed to plan.  Medications Ordered in ED Medications  ketorolac (TORADOL) injection 39 mg (39 mg Intramuscular Given 06/26/17 2232)     Initial Impression / Assessment and Plan / ED Course  I have reviewed the triage vital signs and the nursing notes.  Pertinent labs & imaging results that were available during my care of the patient were reviewed by me and considered in my medical decision making (see chart for details).     Presentation consistent with renal colic. Bedside ultrasound without evidence of hydronephrosis. UA without evidence of infection. Pain control with Toradol IM.  The patient is safe for discharge with strict return precautions.   Final Clinical Impressions(s) / ED Diagnoses   Final diagnoses:  Other microscopic hematuria  Renal colic on left side   Disposition: Discharge  Condition: Good  I have discussed the results, Dx and Tx plan with the patient who expressed understanding and agree(s) with the plan.  Discharge instructions discussed at great length. The patient was given strict return precautions who verbalized understanding of the instructions. No further questions at time of discharge.    New Prescriptions   No medications on file    Follow Up: Mariel Sleet [address] [facility] [place] [address] [phone]     Urology  Schedule an appointment as soon as possible for a visit  As needed      Fatima Blank, MD 06/26/17 2340

## 2017-06-26 NOTE — ED Notes (Signed)
Alert, NAD, calm, interactive, resps e/u, speaking in clear complete sentences, no dyspnea noted, skin W&D, c/o intermittent L flank pain, c/w past kidney stone sx, and intermittent nausea revolving around pain, (denies: hematuria, UTI sx, frequency, urgency, change in stream, incomplete bladder emptying, fever, chills, numbness, tingling, weakness, sob, vd, dizziness or visual changes), no meds PTA. Pt of [facility] in [place].

## 2017-09-04 ENCOUNTER — Encounter (HOSPITAL_BASED_OUTPATIENT_CLINIC_OR_DEPARTMENT_OTHER): Payer: Self-pay | Admitting: *Deleted

## 2017-09-04 ENCOUNTER — Emergency Department (HOSPITAL_BASED_OUTPATIENT_CLINIC_OR_DEPARTMENT_OTHER)
Admission: EM | Admit: 2017-09-04 | Discharge: 2017-09-04 | Disposition: A | Discharge status: Home / Self Care | Payer: Self-pay | Attending: Emergency Medicine | Admitting: Emergency Medicine

## 2017-09-04 ENCOUNTER — Emergency Department (HOSPITAL_BASED_OUTPATIENT_CLINIC_OR_DEPARTMENT_OTHER): Payer: Self-pay

## 2017-09-04 DIAGNOSIS — R1032 Left lower quadrant pain: Secondary | ICD-10-CM | POA: Insufficient documentation

## 2017-09-04 DIAGNOSIS — Z79899 Other long term (current) drug therapy: Secondary | ICD-10-CM | POA: Insufficient documentation

## 2017-09-04 DIAGNOSIS — Z87442 Personal history of urinary calculi: Secondary | ICD-10-CM | POA: Insufficient documentation

## 2017-09-04 DIAGNOSIS — E119 Type 2 diabetes mellitus without complications: Secondary | ICD-10-CM | POA: Insufficient documentation

## 2017-09-04 DIAGNOSIS — R109 Unspecified abdominal pain: Secondary | ICD-10-CM

## 2017-09-04 DIAGNOSIS — I10 Essential (primary) hypertension: Secondary | ICD-10-CM | POA: Insufficient documentation

## 2017-09-04 DIAGNOSIS — Z7984 Long term (current) use of oral hypoglycemic drugs: Secondary | ICD-10-CM | POA: Insufficient documentation

## 2017-09-04 LAB — URINALYSIS, ROUTINE W REFLEX MICROSCOPIC
GLUCOSE, UA: NEGATIVE mg/dL
Ketones, ur: NEGATIVE mg/dL
NITRITE: NEGATIVE
PH: 5 (ref 5.0–8.0)
PROTEIN: 100 mg/dL — AB
Specific Gravity, Urine: >1.03 — ABNORMAL HIGH (ref 1.005–1.030)

## 2017-09-04 LAB — CBC WITH DIFFERENTIAL/PLATELET
BASOS PCT: 1 %
Basophils Absolute: 0.1 10*3/uL (ref 0.0–0.1)
EOS ABS: 0.3 10*3/uL (ref 0.0–0.7)
EOS PCT: 3 %
HCT: 36.8 % (ref 36.0–46.0)
HEMOGLOBIN: 12.2 g/dL (ref 12.0–15.0)
Lymphocytes Relative: 28 %
Lymphs Abs: 2.3 10*3/uL (ref 0.7–4.0)
MCH: 28.5 pg (ref 26.0–34.0)
MCHC: 33.2 g/dL (ref 30.0–36.0)
MCV: 86 fL (ref 78.0–100.0)
MONOS PCT: 11 %
Monocytes Absolute: 0.9 10*3/uL (ref 0.1–1.0)
NEUTROS PCT: 57 %
Neutro Abs: 4.7 10*3/uL (ref 1.7–7.7)
PLATELETS: 197 10*3/uL (ref 150–400)
RBC: 4.28 MIL/uL (ref 3.87–5.11)
RDW: 13 % (ref 11.5–15.5)
WBC: 8.2 10*3/uL (ref 4.0–10.5)

## 2017-09-04 LAB — URINALYSIS, MICROSCOPIC (REFLEX)

## 2017-09-04 LAB — BASIC METABOLIC PANEL
Anion gap: 10 (ref 5–15)
BUN: 19 mg/dL (ref 6–20)
CALCIUM: 9 mg/dL (ref 8.9–10.3)
CHLORIDE: 106 mmol/L (ref 101–111)
CO2: 21 mmol/L — ABNORMAL LOW (ref 22–32)
CREATININE: 0.88 mg/dL (ref 0.44–1.00)
Glucose, Bld: 180 mg/dL — ABNORMAL HIGH (ref 65–99)
Potassium: 3.4 mmol/L — ABNORMAL LOW (ref 3.5–5.1)
SODIUM: 137 mmol/L (ref 135–145)

## 2017-09-04 MED ORDER — KETOROLAC TROMETHAMINE 30 MG/ML IJ SOLN
30.0000 mg | Freq: Once | INTRAMUSCULAR | Status: AC
  Administered 2017-09-04: 30 mg via INTRAVENOUS
  Filled 2017-09-04: qty 1

## 2017-09-04 MED ORDER — ONDANSETRON 4 MG PO TBDP: 4.0000 mg | ORAL_TABLET | Freq: Three times a day (TID) | ORAL | 0 refills | Status: DC | PRN

## 2017-09-04 MED ORDER — NAPROXEN 500 MG PO TABS: 500.0000 mg | ORAL_TABLET | Freq: Two times a day (BID) | ORAL | 0 refills | Status: DC

## 2017-09-04 MED ORDER — SODIUM CHLORIDE 0.9 % IV BOLUS (SEPSIS)
1000.0000 mL | Freq: Once | INTRAVENOUS | Status: AC
  Administered 2017-09-04: 1000 mL via INTRAVENOUS

## 2017-09-04 MED ORDER — MORPHINE SULFATE (PF) 4 MG/ML IV SOLN
4.0000 mg | Freq: Once | INTRAVENOUS | Status: AC
  Administered 2017-09-04: 4 mg via INTRAVENOUS
  Filled 2017-09-04: qty 1

## 2017-09-04 MED ORDER — ONDANSETRON HCL 4 MG/2ML IJ SOLN
4.0000 mg | Freq: Once | INTRAMUSCULAR | Status: AC
  Administered 2017-09-04: 4 mg via INTRAVENOUS
  Filled 2017-09-04: qty 2

## 2017-09-04 MED ORDER — OXYCODONE-ACETAMINOPHEN 5-325 MG PO TABS: 1.0000 | ORAL_TABLET | Freq: Four times a day (QID) | ORAL | 0 refills | Status: DC | PRN

## 2017-09-04 NOTE — ED Triage Notes (Signed)
Pt presents with left flank pain that woke her up from sleep about 30 min pta. States she did have bloody urine on Sat but denied any other symptoms until this morning.  C/o nausea but denies vomiting. Pt unable to sit still at triage due to pain and states she has a long history of kidney stones. Denies any fevers.

## 2017-09-04 NOTE — ED Provider Notes (Signed)
[facility] Provider Note   CSN: [id number] Arrival date & time: 09/04/17  0219     History   Chief Complaint Chief Complaint  Patient presents with  . left flank pain    HPI Stacey Colon is a 56 y.o. female.  HPI  This a 73 -year-old female who presents with left flank pain. Patient reports history of kidney stones. She states that she noted hematuria yesterday but no pain. Approximately 30 minutes prior to arrival she woke up with acute onset of left flank pain. Nonradiating. Currently 10 out of 10. She reports nausea and vomiting. No dysuria or fevers. Pain is similar to prior kidney stones. She has not taken anything for the pain.  Past Medical History:  Diagnosis Date  . Bell's palsy   . Crohn disease ([place])   . Diabetes mellitus without complication ([place])   . Fibroids   . Kidney stone   . Sarcoidosis     Patient Active Problem List   Diagnosis Date Noted  . Bell's palsy 11/20/2011  . Lower back pain 11/20/2011  . HTN (hypertension) 11/19/2011  . ARF (acute renal failure) ([place]) 11/19/2011    Past Surgical History:  Procedure Laterality Date  . APPENDECTOMY    . CESAREAN SECTION    . CHOLECYSTECTOMY    . LITHOTRIPSY    . TONSILLECTOMY    . UTERINE FIBROID EMBOLIZATION      OB History    No data available       Home Medications    Prior to Admission medications   Medication Sig Start Date End Date Taking? Authorizing Provider  HYDROcodone-acetaminophen (NORCO/VICODIN) 5-325 MG per tablet Take 1-2 tablets by mouth every 6 (six) hours as needed for moderate pain. Patient not taking: Reported on 07/28/2016 12/18/14   Fredia Sorrow, MD  metFORMIN (GLUCOPHAGE-XR) 500 MG 24 hr tablet Take 500 mg by mouth daily with breakfast.    [provider]  naproxen (NAPROSYN) 500 MG tablet Take 1 tablet (500 mg total) by mouth 2 (two) times daily. 09/04/17   Daniele Yankowski, Barbette Hair, MD  ondansetron (ZOFRAN ODT) 4 MG disintegrating tablet Take 1  tablet (4 mg total) by mouth every 8 (eight) hours as needed for nausea or vomiting. 09/04/17   Faron Tudisco, Barbette Hair, MD  oxyCODONE-acetaminophen (PERCOCET/ROXICET) 5-325 MG tablet Take 1 tablet by mouth every 6 (six) hours as needed for severe pain. 09/04/17   Kymoni Monday, Barbette Hair, MD  pantoprazole (PROTONIX) 20 MG tablet Take 1 tablet (20 mg total) by mouth daily. 07/28/16   Daleen Bo, MD  pravastatin (PRAVACHOL) 40 MG tablet Take 40 mg by mouth every evening.    [provider]  traMADol (ULTRAM) 50 MG tablet Take 50 mg by mouth every 6 (six) hours as needed for pain.    [provider]    Family History No family history on file.  Social History Social History  Substance Use Topics  . Smoking status: Never Smoker  . Smokeless tobacco: Never Used  . Alcohol use No     Allergies   Ambien [zolpidem tartrate] and Nubain [nalbuphine hcl]   Review of Systems Review of Systems  Constitutional: Negative for fever.  Respiratory: Negative for shortness of breath.   Cardiovascular: Negative for chest pain.  Gastrointestinal: Positive for nausea and vomiting. Negative for diarrhea.  Genitourinary: Positive for flank pain and hematuria. Negative for dysuria.  All other systems reviewed and are negative.    Physical Exam Updated Vital Signs BP Marland Kitchen)  152/79 (BP Location: Right Arm)   Pulse 89   Temp 98.3 F (36.8 C)   Resp 19   Ht 5\' 4"  (1.626 m)   Wt 77.1 kg (170 lb)   SpO2 97%   BMI 29.18 kg/m   Physical Exam  Constitutional: She is oriented to person, place, and time.  Uncomfortable appearing but nontoxic, no acute distress  HENT:  Head: Normocephalic and atraumatic.  Cardiovascular: Normal rate, regular rhythm and normal heart sounds.   Pulmonary/Chest: Effort normal and breath sounds normal. No respiratory distress. She has no wheezes.  Abdominal: Soft. Bowel sounds are normal. There is no tenderness. There is no guarding.  Genitourinary:    Genitourinary Comments: No CVA tenderness  Neurological: She is alert and oriented to person, place, and time.  Skin: Skin is warm and dry.  Psychiatric: She has a normal mood and affect.  Nursing note and vitals reviewed.    ED Treatments / Results  Labs (all labs ordered are listed, but only abnormal results are displayed) Labs Reviewed  URINALYSIS, ROUTINE W REFLEX MICROSCOPIC - Abnormal; Notable for the following:       Result Value   Color, Urine RED (*)    APPearance TURBID (*)    Specific Gravity, Urine >1.030 (*)    Hgb urine dipstick LARGE (*)    Bilirubin Urine SMALL (*)    Protein, ur 100 (*)    Leukocytes, UA [place] (*)    All other components within normal limits  URINALYSIS, MICROSCOPIC (REFLEX) - Abnormal; Notable for the following:    Bacteria, UA FEW (*)    Squamous Epithelial / LPF 0-5 (*)    All other components within normal limits  BASIC METABOLIC PANEL - Abnormal; Notable for the following:    Potassium 3.4 (*)    CO2 21 (*)    Glucose, Bld 180 (*)    All other components within normal limits  CBC WITH DIFFERENTIAL/PLATELET    EKG  EKG Interpretation None       Radiology Dg Abdomen 1 View  Result Date: 09/04/2017 CLINICAL DATA:  Acute onset of severe left flank pain and hematuria. Initial encounter. EXAM: ABDOMEN - 1 VIEW COMPARISON:  CT of the abdomen and pelvis from 05/23/2017 FINDINGS: The visualized bowel gas pattern is unremarkable. Scattered air and stool filled loops of colon are seen; no abnormal dilatation of small bowel loops is seen to suggest small bowel obstruction. No free intra-abdominal air is identified, though evaluation for free air is limited on a single supine view. Clips are noted within the right upper quadrant, reflecting prior cholecystectomy. The visualized osseous structures are within normal limits; the sacroiliac joints are unremarkable in appearance. IMPRESSION: Unremarkable bowel gas pattern; no free intra-abdominal  air seen. Small to moderate amount of stool noted in the colon. Electronically Signed   By: Garald Balding M.D.   On: 09/04/2017 04:30    Procedures Procedures (including critical care time)  Medications Ordered in ED Medications  sodium chloride 0.9 % bolus 1,000 mL (1,000 mLs Intravenous New Bag/Given 09/04/17 0322)  morphine 4 MG/ML injection 4 mg (4 mg Intravenous Given 09/04/17 0309)  ondansetron (ZOFRAN) injection 4 mg (4 mg Intravenous Given 09/04/17 0308)  ketorolac (TORADOL) 30 MG/ML injection 30 mg (30 mg Intravenous Given 09/04/17 0440)  morphine 4 MG/ML injection 4 mg (4 mg Intravenous Given 09/04/17 0440)     Initial Impression / Assessment and Plan / ED Course  I have reviewed the triage vital signs  and the nursing notes.  Pertinent labs & imaging results that were available during my care of the patient were reviewed by me and considered in my medical decision making (see chart for details).     Patient presents with left flank pain. Pain consistent with prior kidney stones. She is uncomfortable but nontoxic-appearing on exam. Vital signs reassuring. Patient was given pain and nausea medication as well as fluids. Urinalysis with gross hematuria.No evidence of infection. Basic labwork obtained and largely reassuring. KUB without any evidence of large stone. On recheck, patient much improved after fluids, morphine, and Toradol. She's able to tolerate fluids. Recommendexpectant management and close follow-up with urology on Monday. Patient reports that she has a urologist.  After history, exam, and medical workup I feel the patient has been appropriately medically screened and is safe for discharge home. Pertinent diagnoses were discussed with the patient. Patient was given return precautions.   Final Clinical Impressions(s) / ED Diagnoses   Final diagnoses:  Acute left flank pain  History of kidney stones    New Prescriptions New Prescriptions   NAPROXEN (NAPROSYN)  500 MG TABLET    Take 1 tablet (500 mg total) by mouth 2 (two) times daily.   ONDANSETRON (ZOFRAN ODT) 4 MG DISINTEGRATING TABLET    Take 1 tablet (4 mg total) by mouth every 8 (eight) hours as needed for nausea or vomiting.   OXYCODONE-ACETAMINOPHEN (PERCOCET/ROXICET) 5-325 MG TABLET    Take 1 tablet by mouth every 6 (six) hours as needed for severe pain.     Merryl Hacker, MD 09/04/17 [phone]

## 2018-03-07 ENCOUNTER — Emergency Department (HOSPITAL_BASED_OUTPATIENT_CLINIC_OR_DEPARTMENT_OTHER): Payer: Self-pay

## 2018-03-07 ENCOUNTER — Other Ambulatory Visit: Payer: Self-pay

## 2018-03-07 ENCOUNTER — Emergency Department (HOSPITAL_BASED_OUTPATIENT_CLINIC_OR_DEPARTMENT_OTHER)
Admission: EM | Admit: 2018-03-07 | Discharge: 2018-03-07 | Disposition: A | Discharge status: Home / Self Care | Payer: Self-pay | Attending: Emergency Medicine | Admitting: Emergency Medicine

## 2018-03-07 ENCOUNTER — Encounter (HOSPITAL_BASED_OUTPATIENT_CLINIC_OR_DEPARTMENT_OTHER): Payer: Self-pay | Admitting: *Deleted

## 2018-03-07 DIAGNOSIS — Z79899 Other long term (current) drug therapy: Secondary | ICD-10-CM | POA: Insufficient documentation

## 2018-03-07 DIAGNOSIS — Z7984 Long term (current) use of oral hypoglycemic drugs: Secondary | ICD-10-CM | POA: Insufficient documentation

## 2018-03-07 DIAGNOSIS — E119 Type 2 diabetes mellitus without complications: Secondary | ICD-10-CM | POA: Insufficient documentation

## 2018-03-07 DIAGNOSIS — M13871 Other specified arthritis, right ankle and foot: Secondary | ICD-10-CM | POA: Insufficient documentation

## 2018-03-07 DIAGNOSIS — I10 Essential (primary) hypertension: Secondary | ICD-10-CM | POA: Insufficient documentation

## 2018-03-07 DIAGNOSIS — M19071 Primary osteoarthritis, right ankle and foot: Secondary | ICD-10-CM

## 2018-03-07 MED ORDER — DICLOFENAC SODIUM 1 % TD GEL: 2.0000 g | Freq: Four times a day (QID) | TRANSDERMAL | 0 refills | Status: DC

## 2018-03-07 NOTE — ED Provider Notes (Signed)
[facility] EMERGENCY DEPARTMENT Provider Note   CSN: [id number] Arrival date & time: 03/07/18  1553     History   Chief Complaint Chief Complaint  Patient presents with  . Foot Pain    HPI Stacey Colon is a 57 y.o. female with history of diabetes, kidney stones, arthritis who presents with right foot pain that has been going on for a while, but worse over the past 3 days.  She reports pain as soon as she puts her feet on the ground in the morning.  She works on her feet all day. She denies numbness or tingling.  She has been taking Naprosyn at home without relief.   She has not tried anything else at home.  She reports having to wear a boot on the same foot several years ago for potential stress fracture, however she is unsure.  HPI  Past Medical History:  Diagnosis Date  . Bell's palsy   . Crohn disease ([place])   . Diabetes mellitus without complication ([place])   . Fibroids   . Kidney stone   . Sarcoidosis     Patient Active Problem List   Diagnosis Date Noted  . Bell's palsy 11/20/2011  . Lower back pain 11/20/2011  . HTN (hypertension) 11/19/2011  . ARF (acute renal failure) ([place]) 11/19/2011    Past Surgical History:  Procedure Laterality Date  . APPENDECTOMY    . CESAREAN SECTION    . CHOLECYSTECTOMY    . LITHOTRIPSY    . TONSILLECTOMY    . UTERINE FIBROID EMBOLIZATION       OB History   None      Home Medications    Prior to Admission medications   Medication Sig Start Date End Date Taking? Authorizing Provider  diclofenac sodium (VOLTAREN) 1 % GEL Apply 2 g topically 4 (four) times daily. To affected area. 03/07/18   Kramer Hanrahan, Bea Graff, PA-C  metFORMIN (GLUCOPHAGE-XR) 500 MG 24 hr tablet Take 500 mg by mouth daily with breakfast.    [provider]  ondansetron (ZOFRAN ODT) 4 MG disintegrating tablet Take 1 tablet (4 mg total) by mouth every 8 (eight) hours as needed for nausea or vomiting. 09/04/17   Horton, Barbette Hair, MD    pantoprazole (PROTONIX) 20 MG tablet Take 1 tablet (20 mg total) by mouth daily. 07/28/16   Daleen Bo, MD  pravastatin (PRAVACHOL) 40 MG tablet Take 40 mg by mouth every evening.    [provider]  traMADol (ULTRAM) 50 MG tablet Take 50 mg by mouth every 6 (six) hours as needed for pain.    [provider]    Family History History reviewed. No pertinent family history.  Social History Social History   Tobacco Use  . Smoking status: Never Smoker  . Smokeless tobacco: Never Used  Substance Use Topics  . Alcohol use: No  . Drug use: No     Allergies   Ambien [zolpidem tartrate] and Nubain [nalbuphine hcl]   Review of Systems Review of Systems  Constitutional: Negative for fever.  Musculoskeletal: Positive for arthralgias and joint swelling.  Neurological: Negative for numbness.     Physical Exam Updated Vital Signs BP (!) 146/99   Pulse 100   Temp 98.5 F (36.9 C)   Resp 18   Ht 5\' 4"  (1.626 m)   Wt 81.6 kg (180 lb)   SpO2 98%   BMI 30.90 kg/m   Physical Exam  Constitutional: She appears well-developed and well-nourished. No distress.  HENT:  Head: Normocephalic and atraumatic.  Mouth/Throat: Oropharynx is clear and moist. No oropharyngeal exudate.  Eyes: Pupils are equal, round, and reactive to light. Conjunctivae are normal. Right eye exhibits no discharge. Left eye exhibits no discharge. No scleral icterus.  Neck: Normal range of motion. Neck supple.  Cardiovascular: Regular rhythm, normal heart sounds and intact distal pulses. Exam reveals no gallop and no friction rub.  No murmur heard. Pulmonary/Chest: Effort normal and breath sounds normal. No stridor. No respiratory distress. She has no wheezes. She has no rales.  Musculoskeletal: She exhibits no edema.       Right foot: There is tenderness and bony tenderness.       Feet:  Right foot : patient can move toes without difficulty.  No skin changes.  Plantar flexion and dorsiflexion  intact, however with some pain  Neurological: She is alert. Coordination normal.  Skin: Skin is warm and dry. No rash noted. She is not diaphoretic. No pallor.  Psychiatric: She has a normal mood and affect.  Nursing note and vitals reviewed.    ED Treatments / Results  Labs (all labs ordered are listed, but only abnormal results are displayed) Labs Reviewed - No data to display  EKG None  Radiology Dg Ankle Complete Right  Result Date: 03/07/2018 CLINICAL DATA:  57 y/o  F; 3 days of right foot and ankle pain. EXAM: RIGHT FOOT COMPLETE - 3+ VIEW; RIGHT ANKLE - COMPLETE 3+ VIEW COMPARISON:  None. FINDINGS: Right foot: There is no evidence of fracture or dislocation. There is no evidence of arthropathy or other focal bone abnormality. Soft tissues are unremarkable. Dorsal and plantar calcaneal bone spurs. Mild intertarsal osteoarthrosis with osteophyte production. Right ankle: There is no evidence of fracture or dislocation. There is no evidence of arthropathy or other focal bone abnormality. Soft tissues are unremarkable. Dorsal and plantar calcaneal bone spurs. IMPRESSION: 1.  No acute fracture or dislocation identified. 2. Mild intertarsal osteoarthrosis and small dorsal/plantar calcaneal bone spurs. Electronically Signed   By: Kristine Garbe M.D.   On: 03/07/2018 17:31   Dg Foot Complete Right  Result Date: 03/07/2018 CLINICAL DATA:  57 y/o  F; 3 days of right foot and ankle pain. EXAM: RIGHT FOOT COMPLETE - 3+ VIEW; RIGHT ANKLE - COMPLETE 3+ VIEW COMPARISON:  None. FINDINGS: Right foot: There is no evidence of fracture or dislocation. There is no evidence of arthropathy or other focal bone abnormality. Soft tissues are unremarkable. Dorsal and plantar calcaneal bone spurs. Mild intertarsal osteoarthrosis with osteophyte production. Right ankle: There is no evidence of fracture or dislocation. There is no evidence of arthropathy or other focal bone abnormality. Soft tissues are  unremarkable. Dorsal and plantar calcaneal bone spurs. IMPRESSION: 1.  No acute fracture or dislocation identified. 2. Mild intertarsal osteoarthrosis and small dorsal/plantar calcaneal bone spurs. Electronically Signed   By: Kristine Garbe M.D.   On: 03/07/2018 17:31    Procedures Procedures (including critical care time)  Medications Ordered in ED Medications - No data to display   Initial Impression / Assessment and Plan / ED Course  I have reviewed the triage vital signs and the nursing notes.  Pertinent labs & imaging results that were available during my care of the patient were reviewed by me and considered in my medical decision making (see chart for details).     Patient with ongoing right foot pain worse over the past 3 days.  X-ray of the right foot and ankle shows no acute  fracture or dislocation, however mild intertarsal osteoarthrosis and small dorsal/plantar calcaneal bone spurs.  No signs of infection or septic joint.  Will treat with Voltaren gel and ASO.  Supportive treatment discussed including ice and elevation.  Follow-up to podiatry for further evaluation and treatment.  Return precautions discussed.  Patient understands and agrees with plan.  Patient vitals stable throughout ED course and discharged in satisfactory condition.  Final Clinical Impressions(s) / ED Diagnoses   Final diagnoses:  Arthritis of right foot    ED Discharge Orders        Ordered    diclofenac sodium (VOLTAREN) 1 % GEL  4 times daily     03/07/18 1810       Frederica Kuster, PA-C 03/07/18 1819    Charlesetta Shanks, MD 03/11/18 [phone]

## 2018-03-07 NOTE — ED Triage Notes (Signed)
Pt c/o right foot pain w/o injury x 3 days

## 2018-03-07 NOTE — Discharge Instructions (Signed)
Apply Voltaren gel twice daily to your foot.  Wear the brace as needed.  Keep your leg elevated whenever you are not walking on it.  Use ice 3-4 times daily alternating 20 minutes on, 20 minutes off.  You can try ibuprofen instead of Aleve as prescribed over-the-counter.  Please follow-up with the podiatrist below for further evaluation and treatment of your symptoms.  Please return to emergency department if you develop any new or worsening symptoms.

## 2018-03-09 ENCOUNTER — Telehealth: Payer: Self-pay | Admitting: Podiatry

## 2018-03-09 NOTE — Telephone Encounter (Signed)
Pt was seen In Emergency Dept (I left a message to see if shes interested in being seen, with Dr. Jacqualyn Posey)

## 2018-06-24 IMAGING — CT CT ANGIO CHEST
2 of 9 series · 18 of 36 positions shown · IV contrast (OMNI)
Comparison: Same day CXR, CT abdomen and pelvis 11/18/2016

CLINICAL DATA: Dyspnea times several days.  Elevated D-dimer.

EXAM:
CT ANGIOGRAPHY CHEST WITH CONTRAST
TECHNIQUE: Multidetector CT imaging of the chest was performed using the
standard protocol during bolus administration of intravenous
contrast. Multiplanar CT image reconstructions and MIPs were
obtained to evaluate the vascular anatomy.
CONTRAST:  60 cc Isovue 370 IV

[Series 7: thins · axial · 0.67mm/px · z∈[+1171,+1388]mm · 17 of 243 slices shown]
[im 13/243  lung]
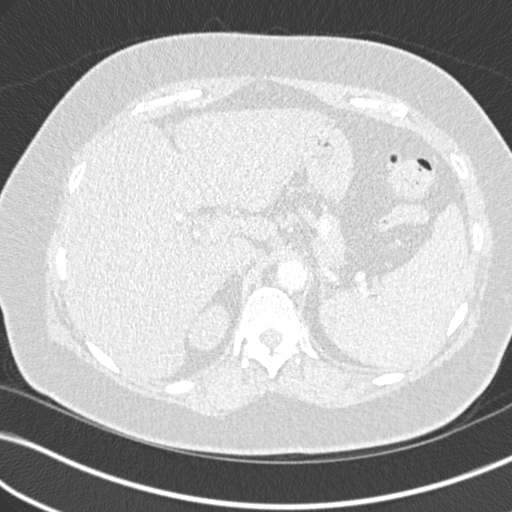
[im 26/243  mediastinal]
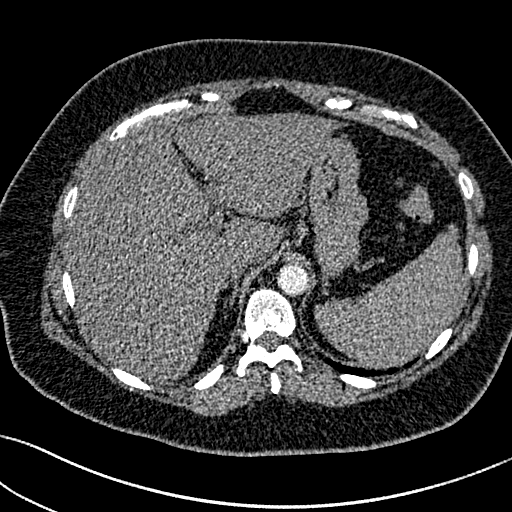
[im 39/243  lung]
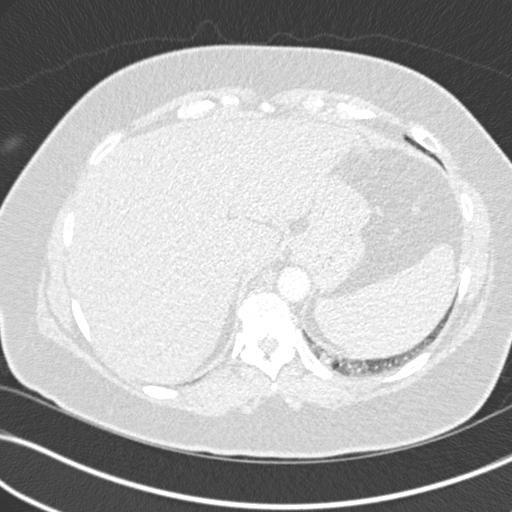
[im 51/243  mediastinal]
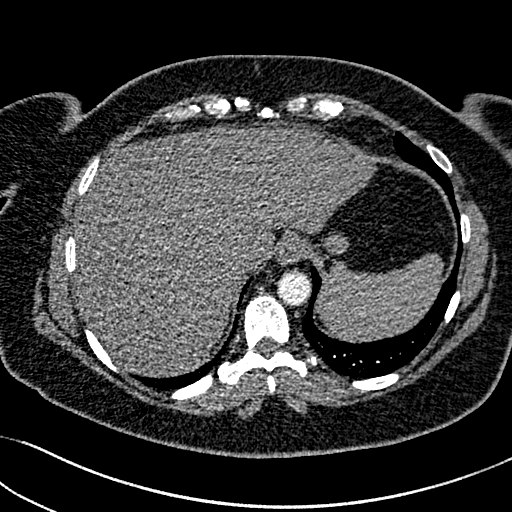
[im 64/243  lung]
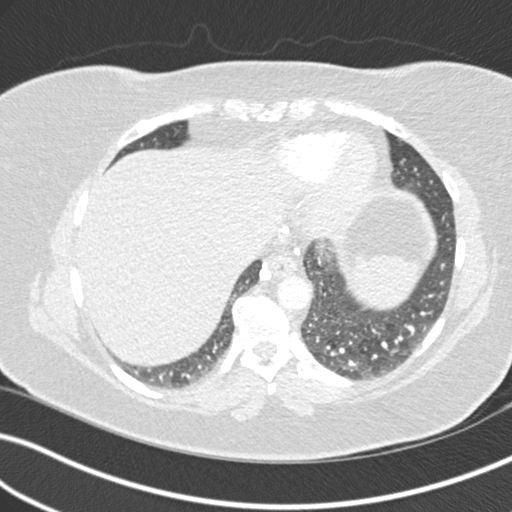
[im 77/243  mediastinal]
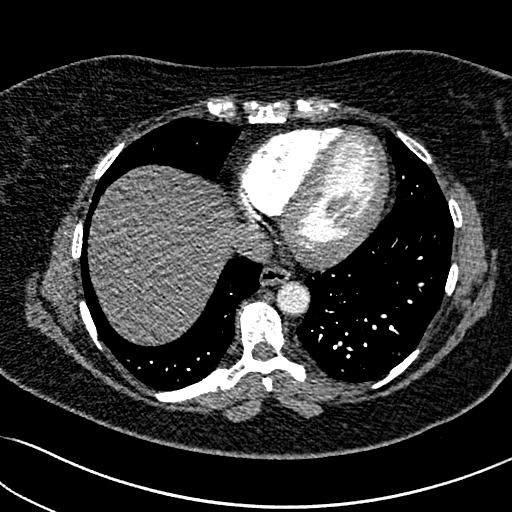
[im 90/243  lung]
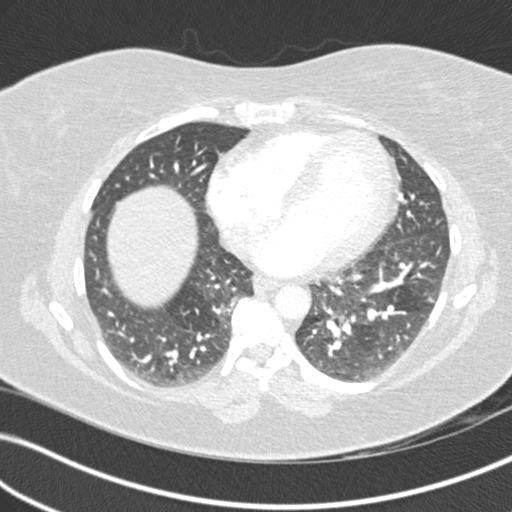
[im 102/243  mediastinal]
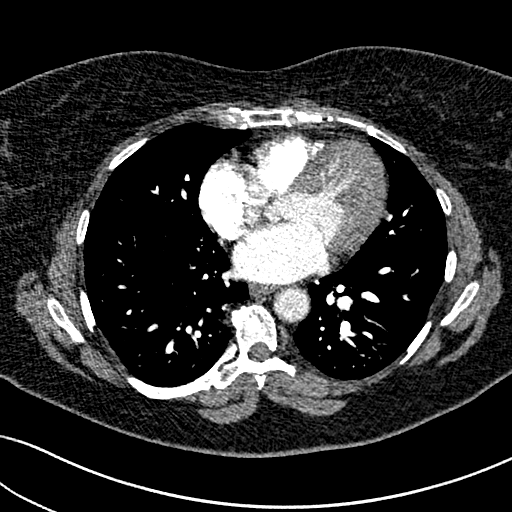
[im 128/243  lung]
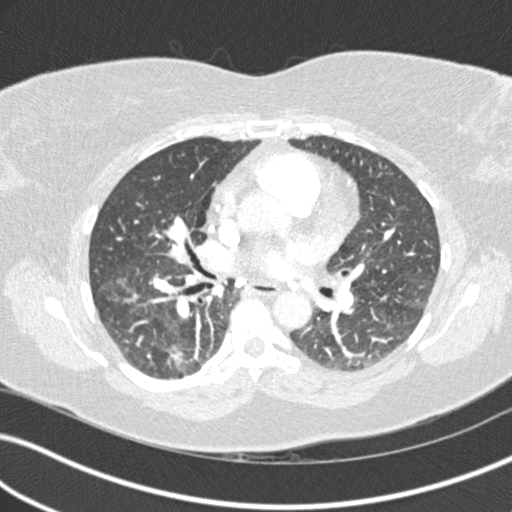
[im 141/243  mediastinal]
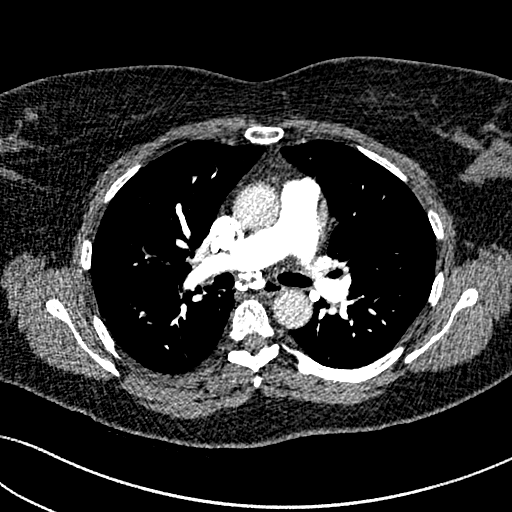
[im 153/243  lung]
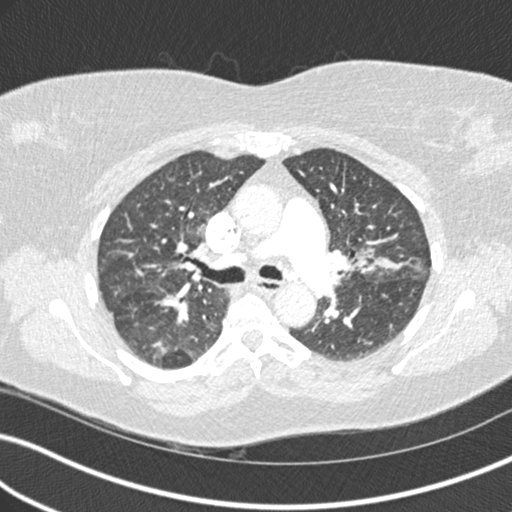
[im 166/243  mediastinal]
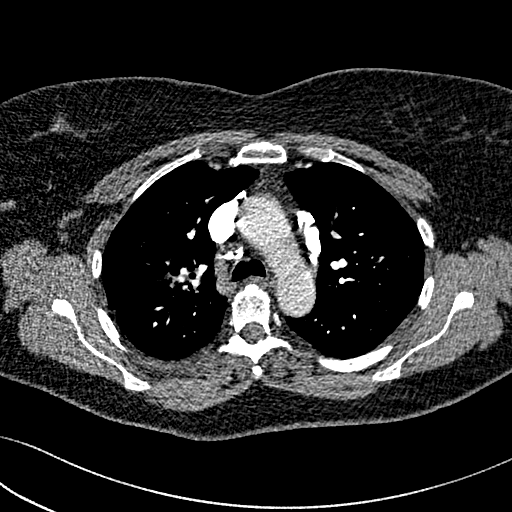
[im 179/243  lung]
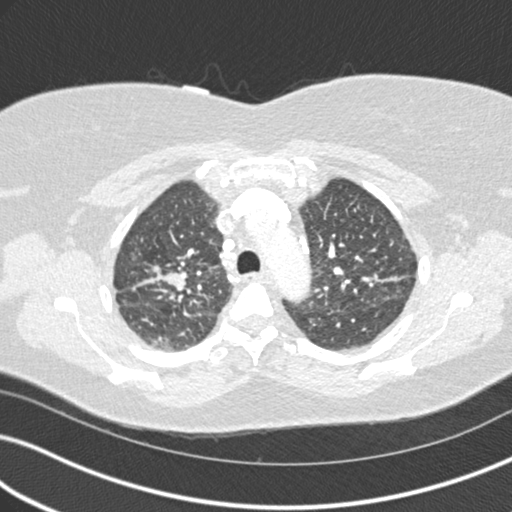
[im 192/243  mediastinal]
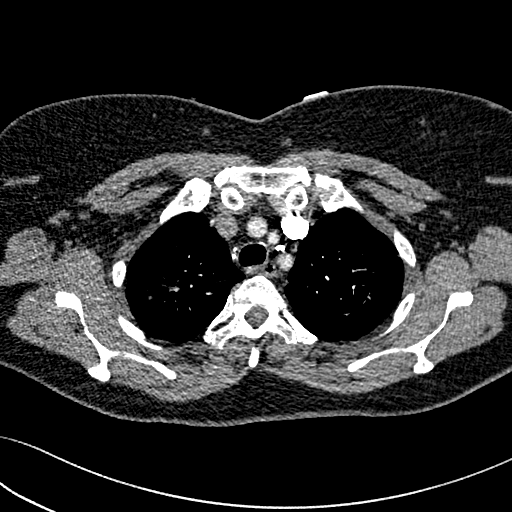
[im 204/243  lung]
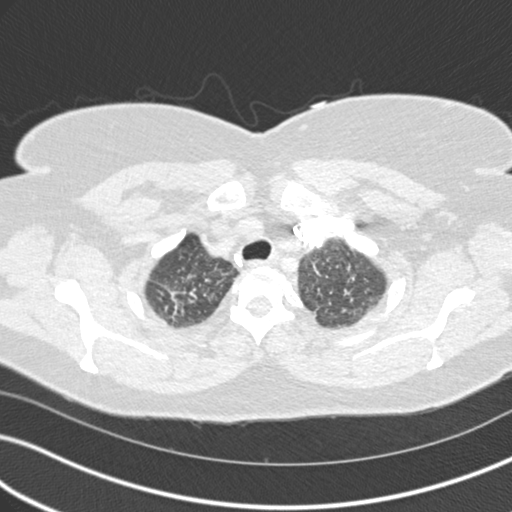
[im 217/243  mediastinal]
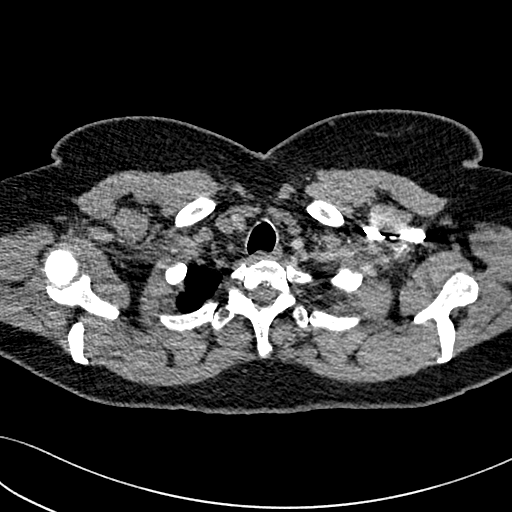
[im 230/243  lung]
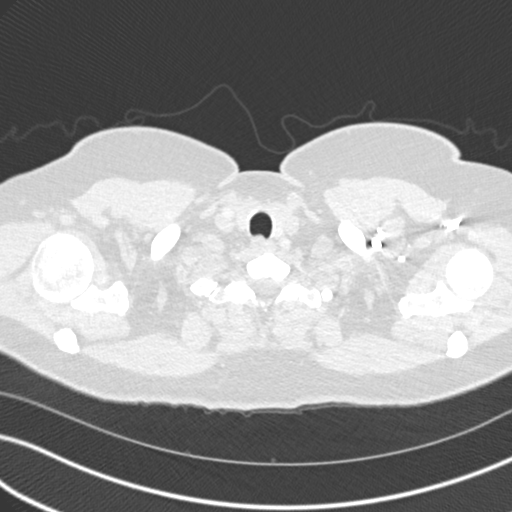

[Series 9: coronal mpr · coronal · 0.49mm/px · 1 of 151 slices shown]
[im 76/151  mediastinal]
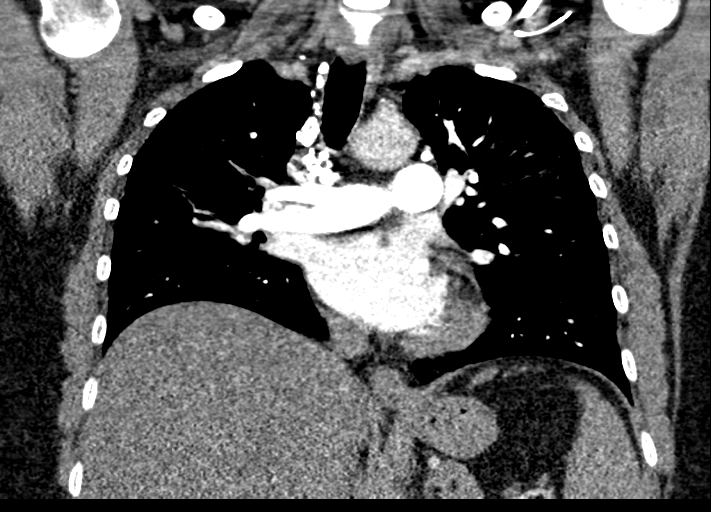

[18 of 36 positions shown; findings below may reference images not displayed]

FINDINGS: Cardiovascular: Normal branch pattern of the great vessels. No
aortic aneurysm or dissection. There is aortic atherosclerosis. No
acute pulmonary embolus. Top normal size cardiac chambers. No
pericardial effusion.

Mediastinum/Nodes: Calcified mediastinal lymph nodes consistent with
old granulomatous disease.

Lungs/Pleura: Ill-defined nodular masslike opacity the right upper
lobe may reflect sequela of granulomatous disease. It is however
noncalcified in appearance and measures 2.2 x 1.3 x 1.1 cm, series
9, image 84 and axial series 8, image 23 and the possibility of
neoplasm is not entirely excluded. No effusion. No pneumothorax.

Upper Abdomen: No acute abnormality.  Cholecystectomy.

Musculoskeletal: No chest wall abnormality. No acute or suspicious
osseous abnormalities. Degenerative changes are noted along the
dorsal spine.

Review of the MIP images confirms the above findings.
IMPRESSION: 1. No acute pulmonary embolus.
2. Ill-defined masslike opacity in the right upper lobe measuring
2.2 x 1.3 x 1.1 cm may be sequela old granulomatous disease given
calcified lymph nodes present within the mediastinum or potentially
scarring. Neoplasm is not entirely excluded. Consider one of the
following in 3 months for both low-risk and high-risk individuals:
(a) repeat chest CT, (b) follow-up PET-CT, or (c) tissue sampling.
This recommendation follows the consensus statement: Guidelines for
Management of Incidental Pulmonary Nodules Detected on CT Images:

## 2018-07-04 ENCOUNTER — Emergency Department (HOSPITAL_COMMUNITY)
Admission: EM | Admit: 2018-07-04 | Discharge: 2018-07-04 | Disposition: A | Discharge status: Home / Self Care | Payer: Medicaid Other | Attending: Emergency Medicine | Admitting: Emergency Medicine

## 2018-07-04 ENCOUNTER — Encounter (HOSPITAL_COMMUNITY): Payer: Self-pay | Admitting: *Deleted

## 2018-07-04 ENCOUNTER — Other Ambulatory Visit: Payer: Self-pay

## 2018-07-04 DIAGNOSIS — E119 Type 2 diabetes mellitus without complications: Secondary | ICD-10-CM | POA: Insufficient documentation

## 2018-07-04 DIAGNOSIS — R112 Nausea with vomiting, unspecified: Secondary | ICD-10-CM | POA: Insufficient documentation

## 2018-07-04 DIAGNOSIS — R61 Generalized hyperhidrosis: Secondary | ICD-10-CM | POA: Insufficient documentation

## 2018-07-04 DIAGNOSIS — Z87442 Personal history of urinary calculi: Secondary | ICD-10-CM | POA: Insufficient documentation

## 2018-07-04 DIAGNOSIS — T8384XA Pain from genitourinary prosthetic devices, implants and grafts, initial encounter: Secondary | ICD-10-CM | POA: Insufficient documentation

## 2018-07-04 DIAGNOSIS — I10 Essential (primary) hypertension: Secondary | ICD-10-CM | POA: Insufficient documentation

## 2018-07-04 DIAGNOSIS — R300 Dysuria: Secondary | ICD-10-CM | POA: Insufficient documentation

## 2018-07-04 DIAGNOSIS — Y829 Unspecified medical devices associated with adverse incidents: Secondary | ICD-10-CM | POA: Insufficient documentation

## 2018-07-04 DIAGNOSIS — Z79899 Other long term (current) drug therapy: Secondary | ICD-10-CM | POA: Insufficient documentation

## 2018-07-04 DIAGNOSIS — R109 Unspecified abdominal pain: Secondary | ICD-10-CM | POA: Insufficient documentation

## 2018-07-04 LAB — URINALYSIS, ROUTINE W REFLEX MICROSCOPIC
Bilirubin Urine: NEGATIVE
Glucose, UA: NEGATIVE mg/dL
Ketones, ur: NEGATIVE mg/dL
Nitrite: NEGATIVE
Protein, ur: 100 mg/dL — AB
Specific Gravity, Urine: 1.019 (ref 1.005–1.030)
pH: 6 (ref 5.0–8.0)

## 2018-07-04 LAB — CBC WITH DIFFERENTIAL/PLATELET
BASOS ABS: 0 10*3/uL (ref 0.0–0.1)
Basophils Relative: 0 %
EOS ABS: 0 10*3/uL (ref 0.0–0.7)
EOS PCT: 0 %
HCT: 44.8 % (ref 36.0–46.0)
Hemoglobin: 15.1 g/dL — ABNORMAL HIGH (ref 12.0–15.0)
Lymphocytes Relative: 12 %
Lymphs Abs: 1.8 10*3/uL (ref 0.7–4.0)
MCH: 29.7 pg (ref 26.0–34.0)
MCHC: 33.7 g/dL (ref 30.0–36.0)
MCV: 88 fL (ref 78.0–100.0)
Monocytes Absolute: 1.2 10*3/uL — ABNORMAL HIGH (ref 0.1–1.0)
Monocytes Relative: 8 %
Neutro Abs: 12.2 10*3/uL — ABNORMAL HIGH (ref 1.7–7.7)
Neutrophils Relative %: 80 %
PLATELETS: 232 10*3/uL (ref 150–400)
RBC: 5.09 MIL/uL (ref 3.87–5.11)
RDW: 13.2 % (ref 11.5–15.5)
WBC: 15.3 10*3/uL — AB (ref 4.0–10.5)

## 2018-07-04 LAB — COMPREHENSIVE METABOLIC PANEL
ALT: 29 U/L (ref 0–44)
AST: 21 U/L (ref 15–41)
Albumin: 3.9 g/dL (ref 3.5–5.0)
Alkaline Phosphatase: 57 U/L (ref 38–126)
Anion gap: 12 (ref 5–15)
BUN: 18 mg/dL (ref 6–20)
CHLORIDE: 105 mmol/L (ref 98–111)
CO2: 23 mmol/L (ref 22–32)
CREATININE: 0.72 mg/dL (ref 0.44–1.00)
Calcium: 9.2 mg/dL (ref 8.9–10.3)
GFR calc non Af Amer: >60 mL/min (ref >60)
Glucose, Bld: 122 mg/dL — ABNORMAL HIGH (ref 70–99)
Potassium: 4.3 mmol/L (ref 3.5–5.1)
Sodium: 140 mmol/L (ref 135–145)
Total Bilirubin: 1 mg/dL (ref 0.3–1.2)
Total Protein: 7.7 g/dL (ref 6.5–8.1)

## 2018-07-04 LAB — LIPASE, BLOOD: Lipase: 37 U/L (ref 11–51)

## 2018-07-04 MED ORDER — MORPHINE SULFATE (PF) 4 MG/ML IV SOLN
6.0000 mg | Freq: Once | INTRAVENOUS | Status: AC
  Administered 2018-07-04: 6 mg via INTRAVENOUS
  Filled 2018-07-04: qty 2

## 2018-07-04 MED ORDER — OXYCODONE-ACETAMINOPHEN 5-325 MG PO TABS: 1.0000 | ORAL_TABLET | ORAL | 0 refills | Status: AC | PRN

## 2018-07-04 MED ORDER — HYDROMORPHONE HCL 1 MG/ML IJ SOLN
1.0000 mg | Freq: Once | INTRAMUSCULAR | Status: AC
  Administered 2018-07-04: 1 mg via INTRAVENOUS
  Filled 2018-07-04: qty 1

## 2018-07-04 MED ORDER — SODIUM CHLORIDE 0.9 % IV BOLUS
500.0000 mL | Freq: Once | INTRAVENOUS | Status: AC
  Administered 2018-07-04: 500 mL via INTRAVENOUS

## 2018-07-04 MED ORDER — ONDANSETRON HCL 4 MG/2ML IJ SOLN
4.0000 mg | Freq: Once | INTRAMUSCULAR | Status: AC
  Administered 2018-07-04: 4 mg via INTRAVENOUS
  Filled 2018-07-04: qty 2

## 2018-07-04 MED ORDER — ONDANSETRON 4 MG PO TBDP: 4.0000 mg | ORAL_TABLET | Freq: Three times a day (TID) | ORAL | 0 refills | Status: AC | PRN

## 2018-07-04 NOTE — ED Provider Notes (Signed)
[facility] DEPT Provider Note   CSN: [id number] Arrival date & time: 07/04/18  0553     History   Chief Complaint Chief Complaint  Patient presents with  . Post-op Problem    HPI Stacey Colon is a 57 y.o. female with past medical history of Crohn's disease, sarcoidosis, multiple renal stones here for evaluation of right flank pain onset this morning at 3 AM.  Sudden, severe, constant.  Nothing makes the pain better or worse.  She took tramadol without relief.  Associated with hematuria, dysuria, nausea, vomiting nonbilious nonbloody x3 and sweats that she attributes to the pain and vomiting.  Also having urinary urgency but feels like she is unable to go.  She has had kidney obstructions in the past and this feels similar.  Of note, patient had lithotripsy with stent yesterday around noon by Dr. Venia Minks for 2 right-sided ureteral stones.  She called after hours urology office and was instructed to come to the ER and that she may need a CT.  States in the past she has had 5-6 lithotripsies and has never had issues or pain like this. No fevers. Postmenopausal.   HPI  Past Medical History:  Diagnosis Date  . Bell's palsy   . Crohn disease ([place])   . Diabetes mellitus without complication ([place])   . Fibroids   . Kidney stone   . Sarcoidosis     Patient Active Problem List   Diagnosis Date Noted  . Bell's palsy 11/20/2011  . Lower back pain 11/20/2011  . HTN (hypertension) 11/19/2011  . ARF (acute renal failure) ([place]) 11/19/2011    Past Surgical History:  Procedure Laterality Date  . APPENDECTOMY    . CESAREAN SECTION    . CHOLECYSTECTOMY    . LITHOTRIPSY    . TONSILLECTOMY    . UTERINE FIBROID EMBOLIZATION       OB History   None      Home Medications    Prior to Admission medications   Medication Sig Start Date End Date Taking? Authorizing Provider  cefdinir (OMNICEF) 300 MG capsule Take 300 mg by mouth daily. Take 1 capsule by  mouth daily for 5 days 07/03/18 07/08/18 Yes [provider]  oxybutynin (DITROPAN) 5 MG tablet Take 5 mg by mouth 2 (two) times daily. 07/03/18  Yes [provider]  POTASSIUM CITRATE PO Take 2 tablets by mouth daily.   Yes [provider]  ondansetron (ZOFRAN ODT) 4 MG disintegrating tablet Take 1 tablet (4 mg total) by mouth every 8 (eight) hours as needed for nausea or vomiting. 07/04/18   Kinnie Feil, PA-C  oxyCODONE-acetaminophen (PERCOCET/ROXICET) 5-325 MG tablet Take 1 tablet by mouth every 4 (four) hours as needed for up to 2 days for severe pain. 07/04/18 07/06/18  Kinnie Feil, PA-C    Family History No family history on file.  Social History Social History   Tobacco Use  . Smoking status: Never Smoker  . Smokeless tobacco: Never Used  Substance Use Topics  . Alcohol use: No  . Drug use: No     Allergies   Ambien [zolpidem tartrate] and Nubain [nalbuphine hcl]   Review of Systems Review of Systems  Constitutional: Positive for diaphoresis.  Gastrointestinal: Positive for abdominal pain, nausea and vomiting.  Genitourinary: Positive for difficulty urinating, dysuria, flank pain and hematuria.  All other systems reviewed and are negative.    Physical Exam Updated Vital Signs BP 135/68 (BP Location: Left Arm)  Pulse 63   Temp 98.5 F (36.9 C)   Resp 15   SpO2 95%   Physical Exam  Constitutional: She is oriented to person, place, and time. She appears well-developed and well-nourished. No distress.  Looks uncomfortable but nontoxic.  HENT:  Head: Normocephalic and atraumatic.  Nose: Nose normal.  Moist mucous membranes   Eyes: Pupils are equal, round, and reactive to light. Conjunctivae and EOM are normal.  Neck: Normal range of motion.  Cardiovascular: Normal rate and regular rhythm.  2+ DP and radial pulses bilaterally. No LE edema.   Pulmonary/Chest: Effort normal and breath sounds normal.  Abdominal: Soft. Bowel  sounds are normal. There is tenderness.  Exquisite right CVA tenderness.  TTP to right lateral abdomen.  RLQ and suprapubic tenderness. No G/R/R. Active BS to lower quadrants. No rash to abdominal wall or flank. No pulsatility.   Musculoskeletal: Normal range of motion.  Neurological: She is alert and oriented to person, place, and time.  Skin: Skin is warm and dry. Capillary refill takes less than 2 seconds.  Psychiatric: She has a normal mood and affect. Her behavior is normal.  Nursing note and vitals reviewed.    ED Treatments / Results  Labs (all labs ordered are listed, but only abnormal results are displayed) Labs Reviewed  CBC WITH DIFFERENTIAL/PLATELET - Abnormal; Notable for the following components:      Result Value   WBC 15.3 (*)    Hemoglobin 15.1 (*)    Neutro Abs 12.2 (*)    Monocytes Absolute 1.2 (*)    All other components within normal limits  COMPREHENSIVE METABOLIC PANEL - Abnormal; Notable for the following components:   Glucose, Bld 122 (*)    All other components within normal limits  URINALYSIS, ROUTINE W REFLEX MICROSCOPIC - Abnormal; Notable for the following components:   APPearance CLOUDY (*)    Hgb urine dipstick LARGE (*)    Protein, ur 100 (*)    Leukocytes, UA SMALL (*)    RBC / HPF >50 (*)    Bacteria, UA RARE (*)    All other components within normal limits  URINE CULTURE  LIPASE, BLOOD    EKG None  Radiology No results found.  Procedures Procedures (including critical care time)  Medications Ordered in ED Medications  sodium chloride 0.9 % bolus 500 mL (0 mLs Intravenous Stopped 07/04/18 0739)  ondansetron (ZOFRAN) injection 4 mg (4 mg Intravenous Given 07/04/18 0649)  morphine 4 MG/ML injection 6 mg (6 mg Intravenous Given 07/04/18 0650)  HYDROmorphone (DILAUDID) injection 1 mg (1 mg Intravenous Given 07/04/18 0750)     Initial Impression / Assessment and Plan / ED Course  I have reviewed the triage vital signs and the nursing  notes.  Pertinent labs & imaging results that were available during my care of the patient were reviewed by me and considered in my medical decision making (see chart for details).  Clinical Course as of Jul 04 999  Tue Jul 04, 2018  0755 Re-evaluated pt who feels pain now returning, will re-dose analgesia.  She is concerned about obstruction and would like CT. Pending urology consult.   [CG]  [id number] [phone] pager for [place]lin    [CG]    Clinical Course User Index [CG] Kinnie Feil, PA-C    Concern for lithotripsy complication including incomplete stone passage/obstruction, parenchymal injury, decrease kidney function, infection, stent displacement. Will obtain screening labs, urine. Bladder scan 0 cc. Cnnsider imaging.   1000: Spoke to on-call  urologist and reviewed patient's work-up in the ER.  He suspected stent colic, less likely parenchyma hematoma or stent complication.  Patient is on cefdinir currently making UTI/infectious etiology very unlikely.  Will dc with percocet and zofran. She has f/u with urology in 48 hours. Chart and available pertinent old records, if available, reviewed by me. Imaging and labs in ER viewed and interpreted by me and used in the medical decision making with formal interpretation from radiologist. Discharge home in stable condition, return precautions discussed.  Patient agreeable with plan for discharge home.   Final Clinical Impressions(s) / ED Diagnoses   Final diagnoses:  Pain due to ureteral stent, initial encounter [facility])    ED Discharge Orders         Ordered    oxyCODONE-acetaminophen (PERCOCET/ROXICET) 5-325 MG tablet  Every 4 hours PRN     07/04/18 0927    ondansetron (ZOFRAN ODT) 4 MG disintegrating tablet  Every 8 hours PRN     07/04/18 0930           Kinnie Feil, PA-C 07/04/18 1000    Fatima Blank, MD 07/05/18 [phone]

## 2018-07-04 NOTE — ED Triage Notes (Signed)
Pt reports lithotripsy yesterday for right sided kidney stone with stent placement. She has had continuing pain, vomiting, and only urinated a small amount. Last took tramadol at 3am

## 2018-07-04 NOTE — Discharge Instructions (Addendum)
You were seen in the emergency department for right flank pain.  Your urine does not look infected.  Your kidney function is normal.  Suspect that this is pain related to the stent.  Less likely an obstruction.  We spoke to Dr. Thomasene Mohair who was on-call for your urologist and he recommends continuing oxybutynin, antibiotics and following up in the next 48 hours as scheduled in the office.  Take Percocet as prescribed for pain and Zofran for nausea.  Return to the ER for fevers, chills, persistent and worsening pain, vomiting, inability to void urine.

## 2018-07-04 NOTE — ED Notes (Signed)
Bladder scan volume: 25mL. PA aware.

## 2018-07-04 NOTE — ED Notes (Signed)
PA made aware of patients pain.  

## 2018-07-05 LAB — URINE CULTURE: Culture: NO GROWTH

## 2018-07-10 ENCOUNTER — Other Ambulatory Visit: Payer: Self-pay

## 2018-07-10 ENCOUNTER — Emergency Department (HOSPITAL_COMMUNITY)
Admission: EM | Admit: 2018-07-10 | Discharge: 2018-07-10 | Disposition: A | Discharge status: Home / Self Care | Payer: Self-pay | Attending: Emergency Medicine | Admitting: Emergency Medicine

## 2018-07-10 ENCOUNTER — Encounter (HOSPITAL_COMMUNITY): Payer: Self-pay | Admitting: Obstetrics and Gynecology

## 2018-07-10 DIAGNOSIS — E119 Type 2 diabetes mellitus without complications: Secondary | ICD-10-CM | POA: Insufficient documentation

## 2018-07-10 DIAGNOSIS — Z79899 Other long term (current) drug therapy: Secondary | ICD-10-CM | POA: Insufficient documentation

## 2018-07-10 DIAGNOSIS — G5631 Lesion of radial nerve, right upper limb: Secondary | ICD-10-CM | POA: Insufficient documentation

## 2018-07-10 DIAGNOSIS — I10 Essential (primary) hypertension: Secondary | ICD-10-CM | POA: Insufficient documentation

## 2018-07-10 NOTE — ED Provider Notes (Signed)
[facility] DEPT Provider Note   CSN: [id number] Arrival date & time: 07/10/18  1309     History   Chief Complaint Chief Complaint  Patient presents with  . Arm Pain    HPI Stacey Colon is a 57 y.o. female here for evaluation of right forearm pain.  Pain described as "pins and needles" and "electric shock". Pain begins in proximal, radial and palmar aspect of right forearm and shoots down to right thumb.  Pain began approx 4 days ago. Pt was in ER (seen by me) for stent colic, she remembers RN tried to start an IV in the affected arm 3 times and one time she felt a sharp shooting pain in the same area while RN was starting IV. Initially had some swelling to this area but this resolved. Now pain is intermittent, moderate, worse with palpation of the area and when flexion and fully extending her elbow. No interventions PTA. No alleviating factors.   No redness, warmth, fevers, chills, radiation of pain into chest, numbness or weakness to extremity.  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   HPI  Past Medical History:  Diagnosis Date  . Bell's palsy   . Crohn disease ([place])   . Diabetes mellitus without complication ([place])   . Fibroids   . Kidney stone   . Sarcoidosis     Patient Active Problem List   Diagnosis Date Noted  . Bell's palsy 11/20/2011  . Lower back pain 11/20/2011  . HTN (hypertension) 11/19/2011  . ARF (acute renal failure) ([place]) 11/19/2011    Past Surgical History:  Procedure Laterality Date  . APPENDECTOMY    . CESAREAN SECTION    . CHOLECYSTECTOMY    . LITHOTRIPSY    . TONSILLECTOMY    . UTERINE FIBROID EMBOLIZATION       OB History    Gravida      Para      Term      Preterm      AB      Living  2     SAB      TAB      Ectopic      Multiple      Live Births               Home Medications    Prior to Admission medications   Medication Sig Start Date End Date Taking? Authorizing  Provider  ondansetron (ZOFRAN ODT) 4 MG disintegrating tablet Take 1 tablet (4 mg total) by mouth every 8 (eight) hours as needed for nausea or vomiting. 07/04/18   Kinnie Feil, PA-C  oxybutynin (DITROPAN) 5 MG tablet Take 5 mg by mouth 2 (two) times daily. 07/03/18   [provider]  POTASSIUM CITRATE PO Take 2 tablets by mouth daily.    [provider]    Family History No family history on file.  Social History Social History   Tobacco Use  . Smoking status: Never Smoker  . Smokeless tobacco: Never Used  Substance Use Topics  . Alcohol use: No  . Drug use: No     Allergies   Ambien [zolpidem tartrate] and Nubain [nalbuphine hcl]   Review of Systems Review of Systems  Musculoskeletal: Positive for myalgias.  All other systems reviewed and are negative.    Physical Exam Updated Vital Signs BP 140/83 (BP Location: Left Arm)   Pulse 75   Temp 99 F (37.2 C) (Oral)   Resp  16   Ht 5\' 4"  (1.626 m)   Wt 85.3 kg   SpO2 100%   BMI 32.27 kg/m   Physical Exam  Constitutional: She is oriented to person, place, and time. She appears well-developed and well-nourished.  Non-toxic appearance.  HENT:  Head: Normocephalic.  Right Ear: External ear normal.  Left Ear: External ear normal.  Nose: Nose normal.  Eyes: Conjunctivae and EOM are normal.  Neck: Full passive range of motion without pain.  Cardiovascular: Normal rate.  2+ radial and ulnar pulses bilaterally.  Pulmonary/Chest: Effort normal. No tachypnea. No respiratory distress.  Musculoskeletal: Normal range of motion. She exhibits tenderness.  Right upper extremity: tenderness to lateral AC fossa,radial aspect of forearm and lateral wrist.  Pain exacerbated with full extension and flexion of elbow. No focal bony tenderness to olecranon, malleoli, wrist bones. Full PROM of wrist, elbow and shoulder. No focal joint edema, warmth, fluctuance. 5/5 strength with hand grip and thumb opposition  bilaterally  Neurological: She is alert and oriented to person, place, and time.  Sensation to light touch intact in median, ulnar, radial nerve distribution.   Skin: Skin is warm and dry. Capillary refill takes less than 2 seconds.  Psychiatric: Her behavior is normal. Thought content normal.     ED Treatments / Results  Labs (all labs ordered are listed, but only abnormal results are displayed) Labs Reviewed - No data to display  EKG None  Radiology No results found.  Procedures Procedures (including critical care time)  Medications Ordered in ED Medications - No data to display   Initial Impression / Assessment and Plan / ED Course  I have reviewed the triage vital signs and the nursing notes.  Pertinent labs & imaging results that were available during my care of the patient were reviewed by me and considered in my medical decision making (see chart for details).     57 year old female with pain described as pins-and-needles, sharp, shooting along radial nerve distribution after recent multiple attempts to insert IV on this extremity.  This is most likely from neuropathy.  No associated muscle wasting, weakness, numbness considered blood clot however she has no asymmetric edema, warmth and her pain is in classic radial nerve distribution which makes blood clot less likely.  She is not having any chest pain or shortness of breath.  No history of blood clots in the past.  Will defer ultrasound at this time.  We will treat symptomatically with ice, rest, NSAIDs and follow-up with PCP for possible Ortho referral.  Discussed return precautions.  She is in agreement.  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

## 2018-07-10 NOTE — ED Triage Notes (Signed)
Pt reports she was recently seen here in the ER and is having pain in the right arm where the IV was.  Pt reports it feels like stabbing nerve pain and her arm. Pt reports she feels like the arm is swollen. Pt tried to go to her PCP but they sent her here in case it is a blood clot.

## 2018-07-10 NOTE — ED Notes (Signed)
Pt reports that she had a recent IV in the rt arm, and since has had swelling and nerve pain. Pt reports that pain shoots from shoulder to hand. Pt reports that symptoms started Tuesday last week.

## 2018-07-10 NOTE — Discharge Instructions (Addendum)
You were seen in the emergency department for pins and needle sensation, discomfort to the right forearm into your right thumb.  I think this is likely from an inflammation of your radial nerve.  Take 600 mg of ibuprofen and/or 500 mg of acetaminophen every 8 hours for the next 5 days.  Ice.  As much as possible, modify your activities to give your right forearm and wrist at rest.  Avoid leaning elbows when seating or driving and avoid prolonged flexion (bending) of the elbow. Return to the ER if there is loss of sensation, heaviness or weakness to your arm, swelling, redness to your forearm or upper extremity.  If symptoms persist, ulnar nerve compression can sometimes worsen and lead to muscle wasting and numbness.  Follow-up with primary care doctor if symptoms persist as you may need referral for surgery.

## 2018-12-27 ENCOUNTER — Emergency Department (HOSPITAL_BASED_OUTPATIENT_CLINIC_OR_DEPARTMENT_OTHER): Payer: BLUE CROSS/BLUE SHIELD

## 2018-12-27 ENCOUNTER — Encounter (HOSPITAL_BASED_OUTPATIENT_CLINIC_OR_DEPARTMENT_OTHER): Payer: Self-pay

## 2018-12-27 ENCOUNTER — Emergency Department (HOSPITAL_BASED_OUTPATIENT_CLINIC_OR_DEPARTMENT_OTHER)
Admission: EM | Admit: 2018-12-27 | Discharge: 2018-12-28 | Disposition: A | Discharge status: Home / Self Care | Payer: BLUE CROSS/BLUE SHIELD | Attending: Emergency Medicine | Admitting: Emergency Medicine

## 2018-12-27 DIAGNOSIS — Z5321 Procedure and treatment not carried out due to patient leaving prior to being seen by health care provider: Secondary | ICD-10-CM | POA: Diagnosis not present

## 2018-12-27 DIAGNOSIS — R109 Unspecified abdominal pain: Secondary | ICD-10-CM | POA: Insufficient documentation

## 2018-12-27 NOTE — ED Notes (Signed)
Pt notified of wait-states she is will not be able to wait to be seen and will probably drive self to [facility] ED-asked to let staff know if she decides to leave

## 2018-12-27 NOTE — ED Notes (Signed)
Called in waiting room, no answer, 2nd attempt

## 2018-12-27 NOTE — ED Triage Notes (Addendum)
Pt c/o pain to right flank-states she has kidney stone-had lithotripsy/stent placement at [facility] yesterday-grimacing-drove self to ED-states she was not able to drive self to [facility]

## 2018-12-27 NOTE — ED Notes (Signed)
Pt called to go to CT  No response from lobby

## 2020-02-09 ENCOUNTER — Emergency Department (HOSPITAL_COMMUNITY): Payer: BLUE CROSS/BLUE SHIELD

## 2020-02-09 ENCOUNTER — Encounter (HOSPITAL_COMMUNITY): Payer: Self-pay | Admitting: Emergency Medicine

## 2020-02-09 ENCOUNTER — Other Ambulatory Visit: Payer: Self-pay

## 2020-02-09 ENCOUNTER — Emergency Department (HOSPITAL_COMMUNITY)
Admission: EM | Admit: 2020-02-09 | Discharge: 2020-02-10 | Disposition: A | Discharge status: Home / Self Care | Payer: BLUE CROSS/BLUE SHIELD | Attending: Emergency Medicine | Admitting: Emergency Medicine

## 2020-02-09 DIAGNOSIS — I10 Essential (primary) hypertension: Secondary | ICD-10-CM | POA: Insufficient documentation

## 2020-02-09 DIAGNOSIS — E119 Type 2 diabetes mellitus without complications: Secondary | ICD-10-CM | POA: Diagnosis not present

## 2020-02-09 DIAGNOSIS — Z79899 Other long term (current) drug therapy: Secondary | ICD-10-CM | POA: Diagnosis not present

## 2020-02-09 DIAGNOSIS — R002 Palpitations: Secondary | ICD-10-CM | POA: Diagnosis present

## 2020-02-09 LAB — CBC
HCT: 39.3 % (ref 36.0–46.0)
Hemoglobin: 12.9 g/dL (ref 12.0–15.0)
MCH: 29.9 pg (ref 26.0–34.0)
MCHC: 32.8 g/dL (ref 30.0–36.0)
MCV: 91 fL (ref 80.0–100.0)
Platelets: 260 10*3/uL (ref 150–400)
RBC: 4.32 MIL/uL (ref 3.87–5.11)
RDW: 12.6 % (ref 11.5–15.5)
WBC: 9.7 10*3/uL (ref 4.0–10.5)
nRBC: 0 % (ref 0.0–0.2)

## 2020-02-09 LAB — PROTIME-INR
INR: 1.1 (ref 0.8–1.2)
Prothrombin Time: 14.5 seconds (ref 11.4–15.2)

## 2020-02-09 LAB — BASIC METABOLIC PANEL
Anion gap: 10 (ref 5–15)
BUN: 24 mg/dL — ABNORMAL HIGH (ref 6–20)
CO2: 27 mmol/L (ref 22–32)
Calcium: 9.8 mg/dL (ref 8.9–10.3)
Chloride: 103 mmol/L (ref 98–111)
Creatinine, Ser: 0.89 mg/dL (ref 0.44–1.00)
GFR calc Af Amer: >60 mL/min (ref >60)
GFR calc non Af Amer: >60 mL/min (ref >60)
Glucose, Bld: 105 mg/dL — ABNORMAL HIGH (ref 70–99)
Potassium: 5 mmol/L (ref 3.5–5.1)
Sodium: 140 mmol/L (ref 135–145)

## 2020-02-09 LAB — TROPONIN I (HIGH SENSITIVITY): Troponin I (High Sensitivity): 3 ng/L (ref <18)

## 2020-02-09 MED ORDER — SODIUM CHLORIDE 0.9% FLUSH: 3.0000 mL | Freq: Once | INTRAVENOUS | Status: DC

## 2020-02-09 NOTE — ED Triage Notes (Signed)
Patient reports palpitations with mild chest pressure and SOB onset this morning . No cough or fever .

## 2020-02-10 LAB — TROPONIN I (HIGH SENSITIVITY): Troponin I (High Sensitivity): <2 ng/L (ref <18)

## 2020-02-10 NOTE — ED Notes (Signed)
Discharge instructions reviewed with pt. Pt verbalized understanding.   

## 2020-02-10 NOTE — ED Provider Notes (Signed)
[facility] EMERGENCY DEPARTMENT Provider Note   CSN: [id number] Arrival date & time: 02/09/20  2114     History Chief Complaint  Patient presents with  . Palpitations    Stacey Colon is a 59 y.o. female.  Patient presents to the emergency department with a chief complaint of palpitations.  She states that she noticed the palpitations earlier this morning and they persisted for most of the day.  They have now resolved.  She reports that she has had palpitations in the past before, but they have never lasted as long as they did today.  She states that she did have some chest tightness, and felt slightly short of breath.  She states that the symptoms have also resolved.  She reports drinking some coffee this morning and doing a Zumba class, neither of which she does on a routine basis.  She denies any other stimulant use.  Denies any recent travel, surgery, or immobilization.  The history is provided by the patient. No language interpreter was used.       Past Medical History:  Diagnosis Date  . Bell's palsy   . Crohn disease ([place])   . Diabetes mellitus without complication ([place])   . Fibroids   . Kidney stone   . Sarcoidosis     Patient Active Problem List   Diagnosis Date Noted  . Bell's palsy 11/20/2011  . Lower back pain 11/20/2011  . HTN (hypertension) 11/19/2011  . ARF (acute renal failure) ([place]) 11/19/2011    Past Surgical History:  Procedure Laterality Date  . APPENDECTOMY    . CESAREAN SECTION    . CHOLECYSTECTOMY    . LITHOTRIPSY    . TONSILLECTOMY    . UTERINE FIBROID EMBOLIZATION       OB History    Gravida      Para      Term      Preterm      AB      Living  2     SAB      TAB      Ectopic      Multiple      Live Births              No family history on file.  Social History   Tobacco Use  . Smoking status: Never Smoker  . Smokeless tobacco: Never Used  Substance Use Topics  . Alcohol use: No  .  Drug use: No    Home Medications Prior to Admission medications   Medication Sig Start Date End Date Taking? Authorizing Provider  ondansetron (ZOFRAN ODT) 4 MG disintegrating tablet Take 1 tablet (4 mg total) by mouth every 8 (eight) hours as needed for nausea or vomiting. 07/04/18   Kinnie Feil, PA-C  oxybutynin (DITROPAN) 5 MG tablet Take 5 mg by mouth 2 (two) times daily. 07/03/18   [provider]  POTASSIUM CITRATE PO Take 2 tablets by mouth daily.    [provider]    Allergies    Ambien [zolpidem tartrate] and Nubain [nalbuphine hcl]  Review of Systems   Review of Systems  All other systems reviewed and are negative.   Physical Exam Updated Vital Signs BP 97/78   Pulse 100   Temp 98.4 F (36.9 C) (Oral)   Resp 18   Ht 5\' 4"  (1.626 m)   Wt 90 kg   SpO2 99%   BMI 34.06 kg/m   Physical Exam Vitals and nursing  note reviewed.  Constitutional:      General: She is not in acute distress.    Appearance: She is well-developed.  HENT:     Head: Normocephalic and atraumatic.  Eyes:     Conjunctiva/sclera: Conjunctivae normal.  Cardiovascular:     Rate and Rhythm: Normal rate and regular rhythm.     Heart sounds: No murmur.  Pulmonary:     Effort: Pulmonary effort is normal. No respiratory distress.     Breath sounds: Normal breath sounds.  Abdominal:     Palpations: Abdomen is soft.     Tenderness: There is no abdominal tenderness.  Musculoskeletal:        General: Normal range of motion.     Cervical back: Neck supple.  Skin:    General: Skin is warm and dry.  Neurological:     Mental Status: She is alert and oriented to person, place, and time.  Psychiatric:        Mood and Affect: Mood normal.        Behavior: Behavior normal.     ED Results / Procedures / Treatments   Labs (all labs ordered are listed, but only abnormal results are displayed) Labs Reviewed  BASIC METABOLIC PANEL - Abnormal; Notable for the following  components:      Result Value   Glucose, Bld 105 (*)    BUN 24 (*)    All other components within normal limits  CBC  PROTIME-INR  TROPONIN I (HIGH SENSITIVITY)  TROPONIN I (HIGH SENSITIVITY)    EKG EKG Interpretation  Date/Time:  Saturday February 09 2020 21:16:52 EDT Ventricular Rate:  94 PR Interval:  146 QRS Duration: 90 QT Interval:  350 QTC Calculation: 437 R Axis:   98 Text Interpretation: Normal sinus rhythm Rightward axis Borderline ECG No significant change was found Confirmed by Ezequiel Essex [id number]) on 02/10/2020 1:06:39 AM   Radiology DG Chest 2 View  Result Date: 02/09/2020 CLINICAL DATA:  Palpitations EXAM: CHEST - 2 VIEW COMPARISON:  03/01/2019 FINDINGS: Peribronchial thickening. Scarring in the upper lobes. Heart is normal size. Calcified mediastinal lymph nodes. No effusions or acute bony abnormality. IMPRESSION: Bronchitic changes. Chronic changes in the lungs. Old granulomatous disease. Electronically Signed   By: Rolm Baptise M.D.   On: 02/09/2020 22:03    Procedures Procedures (including critical care time)  Medications Ordered in ED Medications  sodium chloride flush (NS) 0.9 % injection 3 mL (has no administration in time range)    ED Course  I have reviewed the triage vital signs and the nursing notes.  Pertinent labs & imaging results that were available during my care of the patient were reviewed by me and considered in my medical decision making (see chart for details).    MDM Rules/Calculators/A&P                      Patient with palpitations that lasted for most of the day today.  She has had palpitations in the past, but they have never lasted as long as stated today.  She did drink some coffee this morning as well as go to Zumba class, which are out of her normal routine.  She generally only drinks water.  I discussed that stimulants can precipitate palpitations.  Today, her laboratory work-up was very reassuring.  Her EKG shows no  dysrhythmias.  I discussed that she would likely need to follow-up with her PCP or with cardiology for continued evaluation for her palpitations.  She is very well-appearing.  I feel that she is stable for discharge and outpatient follow-up.   Final Clinical Impression(s) / ED Diagnoses Final diagnoses:  Palpitations    Rx / DC Orders ED Discharge Orders    None       Montine Circle, PA-C 02/10/20 0115    Ezequiel Essex, MD 02/10/20 [phone]

## 2022-07-27 DIAGNOSIS — M25511 Pain in right shoulder: Secondary | ICD-10-CM | POA: Diagnosis not present

## 2022-07-27 DIAGNOSIS — M6281 Muscle weakness (generalized): Secondary | ICD-10-CM | POA: Diagnosis not present

## 2022-07-27 DIAGNOSIS — M5136 Other intervertebral disc degeneration, lumbar region: Secondary | ICD-10-CM | POA: Diagnosis not present

## 2022-07-27 DIAGNOSIS — Z7409 Other reduced mobility: Secondary | ICD-10-CM | POA: Diagnosis not present

## 2022-07-28 DIAGNOSIS — Z96611 Presence of right artificial shoulder joint: Secondary | ICD-10-CM | POA: Diagnosis not present

## 2022-07-28 DIAGNOSIS — M25511 Pain in right shoulder: Secondary | ICD-10-CM | POA: Diagnosis not present

## 2022-08-06 DIAGNOSIS — Z7409 Other reduced mobility: Secondary | ICD-10-CM | POA: Diagnosis not present

## 2022-08-06 DIAGNOSIS — M25511 Pain in right shoulder: Secondary | ICD-10-CM | POA: Diagnosis not present

## 2022-08-06 DIAGNOSIS — M6281 Muscle weakness (generalized): Secondary | ICD-10-CM | POA: Diagnosis not present

## 2022-08-06 DIAGNOSIS — M5136 Other intervertebral disc degeneration, lumbar region: Secondary | ICD-10-CM | POA: Diagnosis not present

## 2022-08-16 DIAGNOSIS — M7062 Trochanteric bursitis, left hip: Secondary | ICD-10-CM | POA: Diagnosis not present

## 2022-08-16 DIAGNOSIS — M19011 Primary osteoarthritis, right shoulder: Secondary | ICD-10-CM | POA: Diagnosis not present

## 2022-08-16 DIAGNOSIS — Z471 Aftercare following joint replacement surgery: Secondary | ICD-10-CM | POA: Diagnosis not present

## 2022-08-16 DIAGNOSIS — Z96611 Presence of right artificial shoulder joint: Secondary | ICD-10-CM | POA: Diagnosis not present

## 2022-08-16 DIAGNOSIS — M5416 Radiculopathy, lumbar region: Secondary | ICD-10-CM | POA: Diagnosis not present

## 2022-08-16 DIAGNOSIS — M25511 Pain in right shoulder: Secondary | ICD-10-CM | POA: Diagnosis not present

## 2022-08-16 DIAGNOSIS — M47816 Spondylosis without myelopathy or radiculopathy, lumbar region: Secondary | ICD-10-CM | POA: Diagnosis not present

## 2022-08-18 DIAGNOSIS — M79642 Pain in left hand: Secondary | ICD-10-CM | POA: Diagnosis not present

## 2022-08-18 DIAGNOSIS — M19041 Primary osteoarthritis, right hand: Secondary | ICD-10-CM | POA: Diagnosis not present

## 2022-08-18 DIAGNOSIS — M19042 Primary osteoarthritis, left hand: Secondary | ICD-10-CM | POA: Diagnosis not present

## 2022-08-18 DIAGNOSIS — M79641 Pain in right hand: Secondary | ICD-10-CM | POA: Diagnosis not present

## 2022-08-18 DIAGNOSIS — Z96611 Presence of right artificial shoulder joint: Secondary | ICD-10-CM | POA: Diagnosis not present

## 2022-09-28 DIAGNOSIS — G8918 Other acute postprocedural pain: Secondary | ICD-10-CM | POA: Diagnosis not present

## 2022-09-28 DIAGNOSIS — M25511 Pain in right shoulder: Secondary | ICD-10-CM | POA: Diagnosis not present

## 2022-09-28 DIAGNOSIS — T84038A Mechanical loosening of other internal prosthetic joint, initial encounter: Secondary | ICD-10-CM | POA: Diagnosis not present

## 2022-09-28 DIAGNOSIS — T8489XA Other specified complication of internal orthopedic prosthetic devices, implants and grafts, initial encounter: Secondary | ICD-10-CM | POA: Diagnosis not present

## 2022-09-28 DIAGNOSIS — Z96611 Presence of right artificial shoulder joint: Secondary | ICD-10-CM | POA: Diagnosis not present

## 2022-09-29 DIAGNOSIS — Z471 Aftercare following joint replacement surgery: Secondary | ICD-10-CM | POA: Diagnosis not present

## 2022-09-29 DIAGNOSIS — M25511 Pain in right shoulder: Secondary | ICD-10-CM | POA: Diagnosis not present

## 2022-09-29 DIAGNOSIS — Z96611 Presence of right artificial shoulder joint: Secondary | ICD-10-CM | POA: Diagnosis not present

## 2022-09-29 DIAGNOSIS — T84038A Mechanical loosening of other internal prosthetic joint, initial encounter: Secondary | ICD-10-CM | POA: Diagnosis not present

## 2022-10-18 DIAGNOSIS — Z96611 Presence of right artificial shoulder joint: Secondary | ICD-10-CM | POA: Diagnosis not present

## 2022-10-18 DIAGNOSIS — M25511 Pain in right shoulder: Secondary | ICD-10-CM | POA: Diagnosis not present
